# Patient Record
Sex: Male | Born: 1970 | Race: Black or African American | Hispanic: No | Marital: Single | Smoking: Current every day smoker
Health system: Southern US, Community
[De-identification: ages and names within clinical notes are randomized; demographics above are authoritative.]

## PROBLEM LIST (undated history)

## (undated) DIAGNOSIS — D869 Sarcoidosis, unspecified: Secondary | ICD-10-CM

## (undated) DIAGNOSIS — N281 Cyst of kidney, acquired: Secondary | ICD-10-CM

## (undated) DIAGNOSIS — D126 Benign neoplasm of colon, unspecified: Secondary | ICD-10-CM

## (undated) DIAGNOSIS — I10 Essential (primary) hypertension: Secondary | ICD-10-CM

## (undated) DIAGNOSIS — K219 Gastro-esophageal reflux disease without esophagitis: Secondary | ICD-10-CM

## (undated) DIAGNOSIS — K648 Other hemorrhoids: Secondary | ICD-10-CM

## (undated) DIAGNOSIS — K429 Umbilical hernia without obstruction or gangrene: Secondary | ICD-10-CM

## (undated) DIAGNOSIS — K449 Diaphragmatic hernia without obstruction or gangrene: Secondary | ICD-10-CM

## (undated) HISTORY — DX: Diaphragmatic hernia without obstruction or gangrene: K44.9

## (undated) HISTORY — DX: Benign neoplasm of colon, unspecified: D12.6

## (undated) HISTORY — DX: Cyst of kidney, acquired: N28.1

## (undated) HISTORY — DX: Gastro-esophageal reflux disease without esophagitis: K21.9

## (undated) HISTORY — DX: Umbilical hernia without obstruction or gangrene: K42.9

## (undated) HISTORY — DX: Other hemorrhoids: K64.8

## (undated) HISTORY — PX: COLONOSCOPY: SHX174

---

## 2000-05-19 HISTORY — PX: CHEST TUBE INSERTION: SHX231

## 2010-06-04 ENCOUNTER — Emergency Department (HOSPITAL_COMMUNITY)
Admission: EM | Admit: 2010-06-04 | Discharge: 2010-06-04 | Payer: Self-pay | Source: Home / Self Care | Admitting: Emergency Medicine

## 2010-06-17 ENCOUNTER — Emergency Department (HOSPITAL_COMMUNITY)
Admission: EM | Admit: 2010-06-17 | Discharge: 2010-06-17 | Payer: Self-pay | Source: Home / Self Care | Admitting: Emergency Medicine

## 2010-09-16 ENCOUNTER — Emergency Department (HOSPITAL_COMMUNITY)
Admission: EM | Admit: 2010-09-16 | Discharge: 2010-09-16 | Disposition: A | Discharge status: Home / Self Care | Payer: Self-pay | Attending: Emergency Medicine | Admitting: Emergency Medicine

## 2010-09-16 DIAGNOSIS — J329 Chronic sinusitis, unspecified: Secondary | ICD-10-CM | POA: Insufficient documentation

## 2010-09-16 DIAGNOSIS — D869 Sarcoidosis, unspecified: Secondary | ICD-10-CM | POA: Insufficient documentation

## 2010-09-16 DIAGNOSIS — I10 Essential (primary) hypertension: Secondary | ICD-10-CM | POA: Insufficient documentation

## 2010-09-16 DIAGNOSIS — J3489 Other specified disorders of nose and nasal sinuses: Secondary | ICD-10-CM | POA: Insufficient documentation

## 2010-09-16 DIAGNOSIS — R51 Headache: Secondary | ICD-10-CM | POA: Insufficient documentation

## 2010-11-25 ENCOUNTER — Inpatient Hospital Stay (INDEPENDENT_AMBULATORY_CARE_PROVIDER_SITE_OTHER)
Admission: RE | Admit: 2010-11-25 | Discharge: 2010-11-25 | Disposition: A | Discharge status: Home / Self Care | Payer: Self-pay | Source: Ambulatory Visit | Attending: Family Medicine | Admitting: Family Medicine

## 2010-11-25 DIAGNOSIS — K219 Gastro-esophageal reflux disease without esophagitis: Secondary | ICD-10-CM

## 2010-11-25 LAB — RPR: RPR Ser Ql: NONREACTIVE

## 2010-11-25 LAB — HIV ANTIBODY (ROUTINE TESTING W REFLEX): HIV: NONREACTIVE

## 2010-11-26 LAB — GC/CHLAMYDIA PROBE AMP, URINE: GC Probe Amp, Urine: NEGATIVE

## 2014-05-25 ENCOUNTER — Encounter (HOSPITAL_COMMUNITY): Payer: Self-pay | Admitting: Emergency Medicine

## 2014-05-25 ENCOUNTER — Emergency Department (HOSPITAL_COMMUNITY)
Admission: EM | Admit: 2014-05-25 | Discharge: 2014-05-25 | Disposition: A | Discharge status: Home / Self Care | Payer: Self-pay | Source: Home / Self Care | Attending: Family Medicine | Admitting: Family Medicine

## 2014-05-25 DIAGNOSIS — D869 Sarcoidosis, unspecified: Secondary | ICD-10-CM

## 2014-05-25 DIAGNOSIS — I10 Essential (primary) hypertension: Secondary | ICD-10-CM

## 2014-05-25 DIAGNOSIS — J45901 Unspecified asthma with (acute) exacerbation: Secondary | ICD-10-CM

## 2014-05-25 DIAGNOSIS — J0100 Acute maxillary sinusitis, unspecified: Secondary | ICD-10-CM

## 2014-05-25 HISTORY — DX: Sarcoidosis, unspecified: D86.9

## 2014-05-25 HISTORY — DX: Essential (primary) hypertension: I10

## 2014-05-25 LAB — POCT I-STAT, CHEM 8
BUN: 18 mg/dL (ref 6–23)
CALCIUM ION: 1.26 mmol/L — AB (ref 1.12–1.23)
CHLORIDE: 106 meq/L (ref 96–112)
Creatinine, Ser: 1.3 mg/dL (ref 0.50–1.35)
GLUCOSE: 100 mg/dL — AB (ref 70–99)
HEMATOCRIT: 45 % (ref 39.0–52.0)
Hemoglobin: 15.3 g/dL (ref 13.0–17.0)
Potassium: 4.1 mmol/L (ref 3.5–5.1)
Sodium: 142 mmol/L (ref 135–145)
TCO2: 22 mmol/L (ref 0–100)

## 2014-05-25 MED ORDER — METHYLPREDNISOLONE SODIUM SUCC 125 MG IJ SOLR
INTRAMUSCULAR | Status: AC
  Filled 2014-05-25: qty 2

## 2014-05-25 MED ORDER — AEROCHAMBER PLUS FLO-VU MEDIUM MISC
1.0000 | Freq: Once | Status: AC
  Administered 2014-05-25: 1

## 2014-05-25 MED ORDER — AMOXICILLIN-POT CLAVULANATE 875-125 MG PO TABS: 1.0000 | ORAL_TABLET | Freq: Two times a day (BID) | ORAL | Status: DC

## 2014-05-25 MED ORDER — ONDANSETRON 4 MG PO TBDP
4.0000 mg | ORAL_TABLET | Freq: Once | ORAL | Status: AC
  Administered 2014-05-25: 4 mg via ORAL

## 2014-05-25 MED ORDER — METHYLPREDNISOLONE SODIUM SUCC 125 MG IJ SOLR: 125.0000 mg | Freq: Once | INTRAMUSCULAR | Status: DC

## 2014-05-25 MED ORDER — AEROCHAMBER PLUS W/MASK MISC
Status: AC
  Filled 2014-05-25: qty 1

## 2014-05-25 MED ORDER — ONDANSETRON 4 MG PO TBDP
ORAL_TABLET | ORAL | Status: AC
  Filled 2014-05-25: qty 1

## 2014-05-25 MED ORDER — ALBUTEROL SULFATE HFA 108 (90 BASE) MCG/ACT IN AERS
INHALATION_SPRAY | RESPIRATORY_TRACT | Status: AC
  Filled 2014-05-25: qty 6.7

## 2014-05-25 MED ORDER — LISINOPRIL 5 MG PO TABS: 5.0000 mg | ORAL_TABLET | Freq: Every day | ORAL | Status: DC

## 2014-05-25 MED ORDER — METHYLPREDNISOLONE SODIUM SUCC 125 MG IJ SOLR
125.0000 mg | Freq: Once | INTRAMUSCULAR | Status: AC
  Administered 2014-05-25: 125 mg via INTRAMUSCULAR

## 2014-05-25 MED ORDER — PREDNISONE 50 MG PO TABS: ORAL_TABLET | ORAL | Status: DC

## 2014-05-25 MED ORDER — ALBUTEROL SULFATE HFA 108 (90 BASE) MCG/ACT IN AERS
2.0000 | INHALATION_SPRAY | Freq: Once | RESPIRATORY_TRACT | Status: AC
  Administered 2014-05-25: 2 via RESPIRATORY_TRACT

## 2014-05-25 MED ORDER — IPRATROPIUM BROMIDE 0.06 % NA SOLN: 2.0000 | Freq: Four times a day (QID) | NASAL | Status: AC

## 2014-05-25 NOTE — ED Provider Notes (Signed)
CSN: [id number]     Arrival date & time 05/25/14  0865 History   First MD Initiated Contact with Patient 05/25/14 0920     Chief Complaint  Patient presents with  . Facial Pain  . Headache  . Weakness  . Nausea   (Consider location/radiation/quality/duration/timing/severity/associated sxs/prior Treatment) HPI  Developed cough 2 days ago. Mild phlegm production. Associated w/ nasal congestion, facial pain and headaches. Subjective fevers. Denies sick contacts. Also w/ diarrhea during this time but w/o abd pain. Denies nausea and vomiting. Associated w/ intermittent wheezing for past week.    HTN: out of lisinopril since June. Pt does not have primary care doctor. Deneis CP, palpitations, SOB.home BPs from time to tome up to 784 systolic.       Past Medical History  Diagnosis Date  . Hypertension   . Sarcoidosis    History reviewed. No pertinent past surgical history. History reviewed. No pertinent family history. History  Substance Use Topics  . Smoking status: Current Every Day Smoker -- 0.50 packs/day    Types: Cigarettes  . Smokeless tobacco: Never Used  . Alcohol Use: Yes     Comment: occasionally    Review of Systems Per HPI with all other pertinent systems negative.   Allergies  Review of patient's allergies indicates no known allergies.  Home Medications   Prior to Admission medications   Medication Sig Start Date End Date Taking? Authorizing Provider  guaiFENesin-dextromethorphan (ROBITUSSIN DM) 100-10 MG/5ML syrup Take 5 mLs by mouth every 4 (four) hours as needed for cough.   Yes Historical Provider, MD  amoxicillin-clavulanate (AUGMENTIN) 875-125 MG per tablet Take 1 tablet by mouth 2 (two) times daily. 05/25/14   Waldemar Dickens, MD  ipratropium (ATROVENT) 0.06 % nasal spray Place 2 sprays into both nostrils 4 (four) times daily. 05/25/14   Waldemar Dickens, MD  lisinopril (PRINIVIL,ZESTRIL) 5 MG tablet Take 1 tablet (5 mg total) by mouth daily. 05/25/14   Waldemar Dickens, MD  predniSONE (DELTASONE) 50 MG tablet Take daily with breakfast 05/25/14   Waldemar Dickens, MD   BP 135/82 mmHg  Pulse 91  Temp(Src) 98.5 F (36.9 C) (Oral)  Resp 16  SpO2 100% Physical Exam  Constitutional: He is oriented to person, place, and time. He appears well-developed and well-nourished. No distress.  HENT:  Head: Normocephalic and atraumatic.  Mouth/Throat: Oropharynx is clear and moist.  Maxillary sinuses ttp  Eyes: EOM are normal. Pupils are equal, round, and reactive to light.  Neck: Normal range of motion.  Cardiovascular: Normal rate, normal heart sounds and intact distal pulses.   No murmur heard. Pulmonary/Chest: Effort normal.  Wheezing throughout Good air movemement No ronchi or crackles  Abdominal: Soft. Bowel sounds are normal. He exhibits no distension. There is no tenderness.  Musculoskeletal: Normal range of motion. He exhibits no edema or tenderness.  Neurological: He is alert and oriented to person, place, and time.  Skin: No rash noted. He is not diaphoretic. No erythema.  Psychiatric: He has a normal mood and affect. His behavior is normal. Thought content normal.    ED Course  Procedures (including critical care time) Labs Review Labs Reviewed  POCT I-STAT, CHEM 8 - Abnormal; Notable for the following:    Glucose, Bld 100 (*)    Calcium, Ion 1.26 (*)    All other components within normal limits    Imaging Review No results found.   MDM   1. Sarcoidosis   2. Asthma flare  3. Acute maxillary sinusitis, recurrence not specified   4. Essential hypertension    Solumedrol 125mg  im in office Albuterol inh 2 puffs w/ spacer in office Zofran 4mg  ODT in office Pt to take home th inh and spacer Start prednisone daily x 5 days Albuterol Q4 hrs for 24-48 hrs Start nasal atrovent  If no improvement in 24 hrs then start augmentin for albuterol Start probiotic   Cr nml at 1.3 (upper limit of normal) Restart lisinopril F/u w/ new PCP  for repeat labs to check renal function  Precautions given and all questions answered  Linna Darner, MD Family Medicine 05/25/2014, 10:03 AM      Waldemar Dickens, MD 05/25/14 1004

## 2014-05-25 NOTE — Discharge Instructions (Signed)
You are suffering from a sinus infection and from bronchospasm or asthma flare You were given a breathing treatment and steroids to help treat this Please continue to use the inhaler every 4 hours for the next 1-2 days Please take th steroids daily Please also use the nasal atrovent to clear up your sinus congestion Consider using flonase after you finish your steroids Please [facility] for an appointment for a blood pressure check adn labs Your kidney numbers were at the high end of normal (Cr 1.3), and this will need to be checked again soon.  Please take the lisinopril as prescribed Please start the antibiotics if you are not better in 24-48 hrs.

## 2014-05-25 NOTE — ED Notes (Signed)
Pt woke up yesterday with what he described as sinus pressure and pain around his left eye.  This has since developed into a cough, nausea, and weakness.  Pt denies any fever at home and is afebrile here.  He has not had an appetite but has been trying to stay hydrated.

## 2014-05-29 ENCOUNTER — Emergency Department (HOSPITAL_COMMUNITY)
Admission: EM | Admit: 2014-05-29 | Discharge: 2014-05-29 | Disposition: A | Discharge status: Home / Self Care | Payer: Self-pay | Attending: Emergency Medicine | Admitting: Emergency Medicine

## 2014-05-29 ENCOUNTER — Encounter (HOSPITAL_COMMUNITY): Payer: Self-pay | Admitting: Emergency Medicine

## 2014-05-29 ENCOUNTER — Emergency Department (HOSPITAL_COMMUNITY): Payer: Self-pay

## 2014-05-29 DIAGNOSIS — I10 Essential (primary) hypertension: Secondary | ICD-10-CM | POA: Insufficient documentation

## 2014-05-29 DIAGNOSIS — R109 Unspecified abdominal pain: Secondary | ICD-10-CM

## 2014-05-29 DIAGNOSIS — Z79899 Other long term (current) drug therapy: Secondary | ICD-10-CM | POA: Insufficient documentation

## 2014-05-29 DIAGNOSIS — Z862 Personal history of diseases of the blood and blood-forming organs and certain disorders involving the immune mechanism: Secondary | ICD-10-CM | POA: Insufficient documentation

## 2014-05-29 DIAGNOSIS — K2970 Gastritis, unspecified, without bleeding: Secondary | ICD-10-CM | POA: Insufficient documentation

## 2014-05-29 DIAGNOSIS — K648 Other hemorrhoids: Secondary | ICD-10-CM | POA: Insufficient documentation

## 2014-05-29 DIAGNOSIS — Z720 Tobacco use: Secondary | ICD-10-CM | POA: Insufficient documentation

## 2014-05-29 LAB — COMPREHENSIVE METABOLIC PANEL
ALK PHOS: 85 U/L (ref 39–117)
ALT: 41 U/L (ref 0–53)
AST: 39 U/L — AB (ref 0–37)
Albumin: 3.5 g/dL (ref 3.5–5.2)
Anion gap: 6 (ref 5–15)
BUN: 12 mg/dL (ref 6–23)
CHLORIDE: 106 meq/L (ref 96–112)
CO2: 26 mmol/L (ref 19–32)
CREATININE: 1.17 mg/dL (ref 0.50–1.35)
Calcium: 9 mg/dL (ref 8.4–10.5)
GFR calc non Af Amer: 75 mL/min — ABNORMAL LOW (ref >90)
GFR, EST AFRICAN AMERICAN: 87 mL/min — AB (ref >90)
GLUCOSE: 111 mg/dL — AB (ref 70–99)
POTASSIUM: 4.1 mmol/L (ref 3.5–5.1)
Sodium: 138 mmol/L (ref 135–145)
Total Bilirubin: 0.4 mg/dL (ref 0.3–1.2)
Total Protein: 7.9 g/dL (ref 6.0–8.3)

## 2014-05-29 LAB — CBC WITH DIFFERENTIAL/PLATELET
BASOS PCT: 0 % (ref 0–1)
Basophils Absolute: 0 10*3/uL (ref 0.0–0.1)
EOS PCT: 0 % (ref 0–5)
Eosinophils Absolute: 0 10*3/uL (ref 0.0–0.7)
HEMATOCRIT: 38.1 % — AB (ref 39.0–52.0)
HEMOGLOBIN: 13.2 g/dL (ref 13.0–17.0)
Lymphocytes Relative: 14 % (ref 12–46)
Lymphs Abs: 1.3 10*3/uL (ref 0.7–4.0)
MCH: 28.9 pg (ref 26.0–34.0)
MCHC: 34.6 g/dL (ref 30.0–36.0)
MCV: 83.4 fL (ref 78.0–100.0)
MONO ABS: 0.2 10*3/uL (ref 0.1–1.0)
MONOS PCT: 3 % (ref 3–12)
NEUTROS PCT: 83 % — AB (ref 43–77)
Neutro Abs: 7.4 10*3/uL (ref 1.7–7.7)
PLATELETS: 303 10*3/uL (ref 150–400)
RBC: 4.57 MIL/uL (ref 4.22–5.81)
RDW: 15.2 % (ref 11.5–15.5)
WBC: 8.9 10*3/uL (ref 4.0–10.5)

## 2014-05-29 LAB — URINALYSIS, ROUTINE W REFLEX MICROSCOPIC
Bilirubin Urine: NEGATIVE
Glucose, UA: NEGATIVE mg/dL
Hgb urine dipstick: NEGATIVE
KETONES UR: NEGATIVE mg/dL
LEUKOCYTES UA: NEGATIVE
Nitrite: NEGATIVE
PH: 5.5 (ref 5.0–8.0)
PROTEIN: NEGATIVE mg/dL
Specific Gravity, Urine: 1.015 (ref 1.005–1.030)
UROBILINOGEN UA: 0.2 mg/dL (ref 0.0–1.0)

## 2014-05-29 LAB — SAMPLE TO BLOOD BANK

## 2014-05-29 LAB — POC OCCULT BLOOD, ED: FECAL OCCULT BLD: POSITIVE — AB

## 2014-05-29 MED ORDER — IOHEXOL 300 MG/ML  SOLN: 25.0000 mL | INTRAMUSCULAR | Status: AC

## 2014-05-29 MED ORDER — PANTOPRAZOLE SODIUM 40 MG PO TBEC: 40.0000 mg | DELAYED_RELEASE_TABLET | Freq: Every day | ORAL | Status: DC

## 2014-05-29 MED ORDER — HYDROCORTISONE 2.5 % RE CREA: TOPICAL_CREAM | RECTAL | Status: DC

## 2014-05-29 MED ORDER — SODIUM CHLORIDE 0.9 % IV BOLUS (SEPSIS)
1000.0000 mL | Freq: Once | INTRAVENOUS | Status: AC
  Administered 2014-05-29: 1000 mL via INTRAVENOUS

## 2014-05-29 MED ORDER — TRAMADOL HCL 50 MG PO TABS: 50.0000 mg | ORAL_TABLET | Freq: Four times a day (QID) | ORAL | Status: DC | PRN

## 2014-05-29 MED ORDER — SODIUM CHLORIDE 0.9 % IV SOLN
80.0000 mg | Freq: Once | INTRAVENOUS | Status: AC
  Administered 2014-05-29: 80 mg via INTRAVENOUS
  Filled 2014-05-29: qty 80

## 2014-05-29 MED ORDER — MORPHINE SULFATE 4 MG/ML IJ SOLN
4.0000 mg | Freq: Once | INTRAMUSCULAR | Status: AC
  Administered 2014-05-29: 4 mg via INTRAVENOUS
  Filled 2014-05-29: qty 1

## 2014-05-29 MED ORDER — IOHEXOL 300 MG/ML  SOLN
100.0000 mL | Freq: Once | INTRAMUSCULAR | Status: AC | PRN
  Administered 2014-05-29: 100 mL via INTRAVENOUS

## 2014-05-29 NOTE — ED Notes (Signed)
Pt sts dark rectal bleeding x 3 days with some generalized abd pain; pt sts pain in lower back

## 2014-05-29 NOTE — ED Notes (Signed)
Patient transported to CT 

## 2014-05-29 NOTE — Discharge Instructions (Signed)
[facility] to get [facility].   Follow up with [facility].   Take protonix daily.   Use anusol cream for hemorrhoids.   Take tylenol for pain, avoid motrin or alleve.   Take tramadol for severe pain. It may cause your gastritis to get worse.   Return to ER if you have more rectal bleeding, severe pain, vomiting up blood.

## 2014-05-29 NOTE — ED Provider Notes (Addendum)
CSN: [id number]     Arrival date & time 05/29/14  1214 History   First MD Initiated Contact with Patient 05/29/14 1505     Chief Complaint  Patient presents with  . Rectal Bleeding  . Abdominal Pain     (Consider location/radiation/quality/duration/timing/severity/associated sxs/prior Treatment) The history is provided by the patient.  Rodney Knox is a 44 y.o. male hx of HTN, sarcoidosis here with rectal bleeding, abdominal pain. Was started on prednisone, augmentin, lisinopril 3 days ago at urgent care for possible sinusitis and sarcoidosis flare. For the last 3 days, he has been having brown stool with blood around it, about 1-2 episodes a day. Had some diarrhea that resolved. Also diffuse abdominal pain and lower back pain. Denies vomiting or fever. Denies chest pain or shortness of breath.    Past Medical History  Diagnosis Date  . Hypertension   . Sarcoidosis    History reviewed. No pertinent past surgical history. History reviewed. No pertinent family history. History  Substance Use Topics  . Smoking status: Current Every Day Smoker -- 0.50 packs/day    Types: Cigarettes  . Smokeless tobacco: Never Used  . Alcohol Use: Yes     Comment: occasionally    Review of Systems  Gastrointestinal: Positive for abdominal pain, blood in stool and hematochezia.  All other systems reviewed and are negative.     Allergies  Review of patient's allergies indicates no known allergies.  Home Medications   Prior to Admission medications   Medication Sig Start Date End Date Taking? Authorizing Provider  albuterol (PROVENTIL HFA;VENTOLIN HFA) 108 (90 BASE) MCG/ACT inhaler Inhale 1-2 puffs into the lungs every 6 (six) hours as needed for wheezing or shortness of breath.   Yes Historical Provider, MD  amoxicillin-clavulanate (AUGMENTIN) 875-125 MG per tablet Take 1 tablet by mouth 2 (two) times daily. 05/25/14  Yes Waldemar Dickens, MD  dextromethorphan (DELSYM) 30 MG/5ML liquid Take 60  mg by mouth as needed for cough.   Yes Historical Provider, MD  lisinopril (PRINIVIL,ZESTRIL) 5 MG tablet Take 1 tablet (5 mg total) by mouth daily. 05/25/14  Yes Waldemar Dickens, MD  predniSONE (DELTASONE) 50 MG tablet Take daily with breakfast Patient taking differently: Take 50 mg by mouth daily with breakfast. Take daily with breakfast 05/25/14  Yes Waldemar Dickens, MD  Pseudoeph-Bromphen-DM (COLD & COUGH DM PO) Take 2 capsules by mouth as needed (for cold).   Yes Historical Provider, MD  ipratropium (ATROVENT) 0.06 % nasal spray Place 2 sprays into both nostrils 4 (four) times daily. 05/25/14   Waldemar Dickens, MD   BP 165/101 mmHg  Pulse 65  Temp(Src) 98.4 F (36.9 C) (Oral)  Resp 18  SpO2 99% Physical Exam  Constitutional: He is oriented to person, place, and time.  Uncomfortable   HENT:  Head: Normocephalic.  Mouth/Throat: Oropharynx is clear and moist.  Eyes: Conjunctivae are normal. Pupils are equal, round, and reactive to light.  Neck: Normal range of motion. Neck supple.  Cardiovascular: Normal rate, regular rhythm and normal heart sounds.   Pulmonary/Chest: Effort normal and breath sounds normal. No respiratory distress. He has no wheezes. He has no rales.  Abdominal: Soft. Bowel sounds are normal.  Mild diffuse tenderness, no rebound, worse in LLQ.   Genitourinary:  ? Internal hemorrhoid. Slightly pink stool.   Musculoskeletal: Normal range of motion. He exhibits no edema or tenderness.  Mild R paralumbar tenderness, no midline tenderness   Neurological: He is alert and oriented to  person, place, and time. No cranial nerve deficit. Coordination normal.  Skin: Skin is warm and dry.  Psychiatric: He has a normal mood and affect. His behavior is normal. Judgment and thought content normal.  Nursing note and vitals reviewed.   ED Course  Procedures (including critical care time) Labs Review Labs Reviewed  CBC WITH DIFFERENTIAL - Abnormal; Notable for the following:    HCT  38.1 (*)    Neutrophils Relative % 83 (*)    All other components within normal limits  COMPREHENSIVE METABOLIC PANEL - Abnormal; Notable for the following:    Glucose, Bld 111 (*)    AST 39 (*)    GFR calc non Af Amer 75 (*)    GFR calc Af Amer 87 (*)    All other components within normal limits  POC OCCULT BLOOD, ED - Abnormal; Notable for the following:    Fecal Occult Bld POSITIVE (*)    All other components within normal limits  URINALYSIS, ROUTINE W REFLEX MICROSCOPIC  SAMPLE TO BLOOD BANK    Imaging Review Ct Abdomen Pelvis W Contrast  05/29/2014   CLINICAL DATA:  44 year old with upper abdominal pain for 2 days. Pain radiates to the left flank and back.  EXAM: CT ABDOMEN AND PELVIS WITH CONTRAST  TECHNIQUE: Multidetector CT imaging of the abdomen and pelvis was performed using the standard protocol following bolus administration of intravenous contrast.  CONTRAST:  135mL OMNIPAQUE IOHEXOL 300 MG/ML  SOLN  COMPARISON:  None.  FINDINGS: Lung bases are clear.  Negative for free intraperitoneal air.  Normal appearance of the liver, gallbladder and portal venous system. Normal appearance of the pancreas, spleen and adrenal glands. There is a small low-density structure in both kidneys. These are too small to definitively characterize but likely represent cysts. The largest roughly measures 0.9 cm in the left kidney mid pole region. Negative for hydronephrosis. Normal appearance of the urinary bladder. Asymmetric wall thickening in the stomach anterior body region. This finding is nonspecific and no evidence for perigastric inflammation. Mild atherosclerotic disease in the common iliac arteries bilaterally. There is no significant free fluid or lymphadenopathy. No gross abnormality to the prostate or urinary bladder.  Small calcification in the appendix. No evidence for acute appendix inflammation. No gross abnormality to the small bowel or large bowel. There is a small periumbilical hernia  containing fat.  Small focus of sclerosis in the right ilium is nonspecific and could represent a small bone island. No acute bone abnormalities.  IMPRESSION: Asymmetric wall thickening in the stomach body region. Findings are nonspecific. Gastric inflammation and gastritis cannot be excluded on CT imaging. Recommend clinical correlation. If this is the area of clinical concern, consider further evaluation with endoscopy.  Small periumbilical hernia containing fat.  Possible small renal cysts as described.   Electronically Signed   By: Markus Daft M.D.   On: 05/29/2014 17:17     EKG Interpretation None      MDM   Final diagnoses:  Abdominal pain   Rodney Knox is a 44 y.o. male here with rectal bleeding, ab pain. Consider colitis vs small hemorrhoid bleed. Will get labs, CT ab/pel.   6:55 PM Hg 13, was 15 a week ago. Occ positive but brown stool. CT showed possible gastritis. I think he likely has gastritis from steroid use. Will stop steroids. I called Dr. Deatra Ina from GI who feels that given Hg 13 and that he is young, doesn't need to be admitted. Can start on protonix  and f/u outpatient. He has no insurance. Case management was able to get him f/u at [facility] center in a week. Will have him get repeat CBC tomorrow in clinic. He requests pain meds for back pain. I told him that narcotics may get gastritis worse. He has been taking tylenol, motrin. I told him to stop taking motrin. He wants tramadol, which I reluctantly gave him, instructing him that it may get gastritis worse. Gave strict return precautions.     Wandra Arthurs, MD 05/29/14 1857  Wandra Arthurs, MD 05/29/14 Lurline Hare

## 2014-05-29 NOTE — ED Notes (Signed)
EDP at bedside  

## 2014-05-30 ENCOUNTER — Ambulatory Visit: Payer: Self-pay | Attending: Internal Medicine

## 2014-05-31 NOTE — Care Management (Signed)
  CARE MANAGEMENT ED NOTE 05/31/2014  Patient:  Rodney Knox, Rodney Knox   Account Number:  [id number]  Date Initiated:  05/29/2014  Documentation initiated by:  Laurena Slimmer  Subjective/Objective Assessment:   patient presented to [facility] ED c/o rectal bleeding     Subjective/Objective Assessment Detail:     Action/Plan:   Refrral to establish with PCP   Action/Plan Detail:   Anticipated DC Date:  05/29/2014     Status Recommendation to Physician:   Result of Recommendation:  Agreed    DC Planning Services  CM consult  Follow-up appt scheduled    Choice offered to / List presented to: patient            Status of service:  Completed, signed off  ED Comments:   ED Comments Detail:  ED CM consulted by Dr. Darl Householder concerning establsihing f/u care writh PCP. Reviewed record, no PCP or health insurance.Met with patient at bedside, confimed information. discussed the [facility], and the [employer] and [employer] patient agreeable  with establsihing care. Provided information, offered to schedule f/u appt. patient verbaliszed appreciation. Scheduled appt with [facility] for 06/07/14 at 11:15 am patient agreeable. Discussed with Dr. Dominic Pea, he is agreeable and wants to schedule the patient for outpatient CBC tomorrow patient scheduled for 10am at the [facility]. Patient updated on these appts. verbalized understanding teach back done. No further ED CM needs

## 2014-06-07 ENCOUNTER — Encounter: Payer: Self-pay | Admitting: Internal Medicine

## 2014-06-07 ENCOUNTER — Ambulatory Visit: Payer: Self-pay | Attending: Internal Medicine | Admitting: Internal Medicine

## 2014-06-07 VITALS — BP 160/100 | HR 82 | Temp 98.0°F | Resp 16 | Wt 204.6 lb

## 2014-06-07 DIAGNOSIS — F17200 Nicotine dependence, unspecified, uncomplicated: Secondary | ICD-10-CM

## 2014-06-07 DIAGNOSIS — IMO0001 Reserved for inherently not codable concepts without codable children: Secondary | ICD-10-CM

## 2014-06-07 DIAGNOSIS — D860 Sarcoidosis of lung: Secondary | ICD-10-CM | POA: Insufficient documentation

## 2014-06-07 DIAGNOSIS — K2970 Gastritis, unspecified, without bleeding: Secondary | ICD-10-CM | POA: Insufficient documentation

## 2014-06-07 DIAGNOSIS — D869 Sarcoidosis, unspecified: Secondary | ICD-10-CM | POA: Insufficient documentation

## 2014-06-07 DIAGNOSIS — Z720 Tobacco use: Secondary | ICD-10-CM | POA: Insufficient documentation

## 2014-06-07 DIAGNOSIS — K649 Unspecified hemorrhoids: Secondary | ICD-10-CM | POA: Insufficient documentation

## 2014-06-07 DIAGNOSIS — Z23 Encounter for immunization: Secondary | ICD-10-CM | POA: Insufficient documentation

## 2014-06-07 DIAGNOSIS — I10 Essential (primary) hypertension: Secondary | ICD-10-CM | POA: Insufficient documentation

## 2014-06-07 DIAGNOSIS — R030 Elevated blood-pressure reading, without diagnosis of hypertension: Secondary | ICD-10-CM

## 2014-06-07 DIAGNOSIS — K625 Hemorrhage of anus and rectum: Secondary | ICD-10-CM | POA: Insufficient documentation

## 2014-06-07 DIAGNOSIS — Z862 Personal history of diseases of the blood and blood-forming organs and certain disorders involving the immune mechanism: Secondary | ICD-10-CM

## 2014-06-07 LAB — CBC WITH DIFFERENTIAL/PLATELET
Basophils Absolute: 0 10*3/uL (ref 0.0–0.1)
Basophils Relative: 0 % (ref 0–1)
EOS ABS: 0.1 10*3/uL (ref 0.0–0.7)
EOS PCT: 2 % (ref 0–5)
HEMATOCRIT: 40.1 % (ref 39.0–52.0)
HEMOGLOBIN: 13.4 g/dL (ref 13.0–17.0)
LYMPHS ABS: 1.9 10*3/uL (ref 0.7–4.0)
LYMPHS PCT: 27 % (ref 12–46)
MCH: 28.5 pg (ref 26.0–34.0)
MCHC: 33.4 g/dL (ref 30.0–36.0)
MCV: 85.1 fL (ref 78.0–100.0)
MPV: 9.3 fL (ref 8.6–12.4)
Monocytes Absolute: 0.6 10*3/uL (ref 0.1–1.0)
Monocytes Relative: 9 % (ref 3–12)
Neutro Abs: 4.5 10*3/uL (ref 1.7–7.7)
Neutrophils Relative %: 62 % (ref 43–77)
Platelets: 384 10*3/uL (ref 150–400)
RBC: 4.71 MIL/uL (ref 4.22–5.81)
RDW: 15.4 % (ref 11.5–15.5)
WBC: 7.2 10*3/uL (ref 4.0–10.5)

## 2014-06-07 LAB — COMPLETE METABOLIC PANEL WITH GFR
ALK PHOS: 83 U/L (ref 39–117)
ALT: 28 U/L (ref 0–53)
AST: 24 U/L (ref 0–37)
Albumin: 4.1 g/dL (ref 3.5–5.2)
BUN: 11 mg/dL (ref 6–23)
CHLORIDE: 106 meq/L (ref 96–112)
CO2: 27 meq/L (ref 19–32)
Calcium: 9.9 mg/dL (ref 8.4–10.5)
Creat: 1.05 mg/dL (ref 0.50–1.35)
GFR, EST NON AFRICAN AMERICAN: 87 mL/min
GFR, Est African American: >89 mL/min
Glucose, Bld: 100 mg/dL — ABNORMAL HIGH (ref 70–99)
Potassium: 4.6 mEq/L (ref 3.5–5.3)
SODIUM: 139 meq/L (ref 135–145)
TOTAL PROTEIN: 7.8 g/dL (ref 6.0–8.3)
Total Bilirubin: 0.4 mg/dL (ref 0.2–1.2)

## 2014-06-07 LAB — LIPID PANEL
Cholesterol: 192 mg/dL (ref 0–200)
HDL: 45 mg/dL (ref >39)
LDL CALC: 119 mg/dL — AB (ref 0–99)
TRIGLYCERIDES: 138 mg/dL (ref <150)
Total CHOL/HDL Ratio: 4.3 Ratio
VLDL: 28 mg/dL (ref 0–40)

## 2014-06-07 LAB — TSH: TSH: 1.454 u[IU]/mL (ref 0.350–4.500)

## 2014-06-07 MED ORDER — CLONIDINE HCL 0.1 MG PO TABS
0.2000 mg | ORAL_TABLET | Freq: Once | ORAL | Status: AC
  Administered 2014-06-07: 0.2 mg via ORAL

## 2014-06-07 MED ORDER — PANTOPRAZOLE SODIUM 40 MG PO TBEC: 40.0000 mg | DELAYED_RELEASE_TABLET | Freq: Every day | ORAL | Status: DC

## 2014-06-07 MED ORDER — NICOTINE 21 MG/24HR TD PT24: 21.0000 mg | MEDICATED_PATCH | Freq: Every day | TRANSDERMAL | Status: DC

## 2014-06-07 MED ORDER — RANITIDINE HCL 150 MG PO TABS: 150.0000 mg | ORAL_TABLET | Freq: Every day | ORAL | Status: DC

## 2014-06-07 MED ORDER — LOSARTAN POTASSIUM-HCTZ 50-12.5 MG PO TABS: 1.0000 | ORAL_TABLET | Freq: Every day | ORAL | Status: DC

## 2014-06-07 MED ORDER — HYDROCORTISONE 2.5 % RE CREA: TOPICAL_CREAM | RECTAL | Status: DC

## 2014-06-07 NOTE — Progress Notes (Signed)
Patient here for follow up from the ED Patient has a history of  Sarcoidosis and HTN Complains of having rectal bleeding and epigastric pain Patient states the protonix is not really working  Takes omeprazole as well Today presents with elevated blood pressure

## 2014-06-07 NOTE — Progress Notes (Signed)
Patient Demographics  Rodney Knox, is a 44 y.o. male  XTG:[id number]  EVO:[id number]  DOB - August 09, 1970  CC:  Chief Complaint  Patient presents with  . Establish Care       HPI: Rodney Knox is a 44 y.o. male here today to establish medical care.Patient has history of hypertension, sarcoidosis, recently went to the emergency room with symptoms of abdominal pain, rectal bleeding, patient had darker blood test positive stool, CT abdomen reported possible gastritis, there was a drop in hemoglobin level, GI was consulted and advised to start on Protonix, patient also has chronic lower back pain and was prescribed tramadol when necessary for pain. Today his blood pressure is elevated, is given clonidine and his repeat manual blood pressure is improved to 163/100, as per patient he has been taking lisinopril 5 mg but does not like taking this medication as per patient he was on a combination medication in the past with hydrochlorothiazide, patient is to smoke cigarettes, advised patient to cut down and quit, also taking Protonix as per patient it does not help much and he has been also taking Prilosec., He was prescribed hydrocortisone cream for hemorrhoids and is requesting refill on the medication still he has noticed some blood in h Patient has No headache, No chest pain, No abdominal pain - No Nausea, No new weakness tingling or numbness, No Cough - SOB.  No Known Allergies Past Medical History  Diagnosis Date  . Hypertension   . Sarcoidosis    Current Outpatient Prescriptions on File Prior to Visit  Medication Sig Dispense Refill  . albuterol (PROVENTIL HFA;VENTOLIN HFA) 108 (90 BASE) MCG/ACT inhaler Inhale 1-2 puffs into the lungs every 6 (six) hours as needed for wheezing or shortness of breath.    Marland Kitchen amoxicillin-clavulanate (AUGMENTIN) 875-125 MG per tablet Take 1 tablet by mouth 2 (two) times daily. 20 tablet 0  . dextromethorphan (DELSYM) 30 MG/5ML liquid Take 60 mg by  mouth as needed for cough.    Marland Kitchen ipratropium (ATROVENT) 0.06 % nasal spray Place 2 sprays into both nostrils 4 (four) times daily. 15 mL 12  . predniSONE (DELTASONE) 50 MG tablet Take daily with breakfast (Patient taking differently: Take 50 mg by mouth daily with breakfast. Take daily with breakfast) 5 tablet 0  . Pseudoeph-Bromphen-DM (COLD & COUGH DM PO) Take 2 capsules by mouth as needed (for cold).    . traMADol (ULTRAM) 50 MG tablet Take 1 tablet (50 mg total) by mouth every 6 (six) hours as needed. 8 tablet 0   No current facility-administered medications on file prior to visit.   History reviewed. No pertinent family history. History   Social History  . Marital Status: Single    Spouse Name: N/A    Number of Children: N/A  . Years of Education: N/A   Occupational History  . Not on file.   Social History Main Topics  . Smoking status: Current Every Day Smoker -- 0.50 packs/day for 20 years    Types: Cigarettes  . Smokeless tobacco: Never Used  . Alcohol Use: 0.0 oz/week    0 Not specified per week     Comment: occasionally  . Drug Use: No  . Sexual Activity: Not on file   Other Topics Concern  . Not on file   Social History Narrative    Review of Systems: Constitutional: Negative for fever, chills, diaphoresis, activity change, appetite change and fatigue. HENT: Negative for ear pain, nosebleeds, congestion, facial swelling, rhinorrhea, neck  pain, neck stiffness and ear discharge.  Eyes: Negative for pain, discharge, redness, itching and visual disturbance. Respiratory: Negative for cough, choking, chest tightness, shortness of breath, wheezing and stridor.  Cardiovascular: Negative for chest pain, palpitations and leg swelling. Gastrointestinal: Negative for abdominal distention. Genitourinary: Negative for dysuria, urgency, frequency, hematuria, flank pain, decreased urine volume, difficulty urinating and dyspareunia.  Musculoskeletal: Negative for back pain, joint  swelling, arthralgia and gait problem. Neurological: Negative for dizziness, tremors, seizures, syncope, facial asymmetry, speech difficulty, weakness, light-headedness, numbness and headaches.  Hematological: Negative for adenopathy. Does not bruise/bleed easily. Psychiatric/Behavioral: Negative for hallucinations, behavioral problems, confusion, dysphoric mood, decreased concentration and agitation.    Objective:   Filed Vitals:   06/07/14 1146  BP: 178/118  Pulse: 82  Temp:   Resp:     Physical Exam: Constitutional: Patient appears well-developed and well-nourished. No distress. HENT: Normocephalic, atraumatic, External right and left ear normal. Oropharynx is clear and moist.  Eyes: Conjunctivae and EOM are normal. PERRLA, no scleral icterus. Neck: Normal ROM. Neck supple. No JVD. No tracheal deviation. No thyromegaly. CVS: RRR, S1/S2 +, no murmurs, no gallops, no carotid bruit.  Pulmonary: Effort and breath sounds normal, no stridor, rhonchi, wheezes, rales.  Abdominal: Soft. BS +, no distension, tenderness, rebound or guarding.  Musculoskeletal: Normal range of motion. No edema and no tenderness.  Neuro: Alert. Normal reflexes, muscle tone coordination. No cranial nerve deficit. Skin: Skin is warm and dry. No rash noted. Not diaphoretic. No erythema. No pallor. Psychiatric: Normal mood and affect. Behavior, judgment, thought content normal.  Lab Results  Component Value Date   WBC 8.9 05/29/2014   HGB 13.2 05/29/2014   HCT 38.1* 05/29/2014   MCV 83.4 05/29/2014   PLT 303 05/29/2014   Lab Results  Component Value Date   CREATININE 1.17 05/29/2014   BUN 12 05/29/2014   NA 138 05/29/2014   K 4.1 05/29/2014   CL 106 05/29/2014   CO2 26 05/29/2014    No results found for: HGBA1C Lipid Panel  No results found for: CHOL, TRIG, HDL, CHOLHDL, VLDL, LDLCALC     Assessment and plan:   1. Elevated blood pressure  - cloNIDine (CATAPRES) tablet 0.2 mg; Take 2 tablets  (0.2 mg total) by mouth once. Repeat manual blood pressure is 160/100.  2. Essential hypertension Advise patient for DASH diet, started him on Hyzaar, patient will come back in 2 weeks per nurse visit BP check Ordered baseline blood work  - CBC with Differential - COMPLETE METABOLIC PANEL WITH GFR - TSH - Lipid panel - Vit D  25 hydroxy (rtn osteoporosis monitoring) - Hemoglobin A1c - losartan-hydrochlorothiazide (HYZAAR) 50-12.5 MG per tablet; Take 1 tablet by mouth daily.  Dispense: 90 tablet; Refill: 3  3. History of sarcoidosis Currently denies any symptoms uses albuterol when necessary  4. Rectal bleeding Will repeat CBC. - Ambulatory referral to Gastroenterology  5. Gastritis Have advised patient for lifestyle modification, quit smoking, avoid  NSAIDs, prescribed Zantac to take at night to and continue with Protonix in the daytime also follow with the GI  - ranitidine (ZANTAC) 150 MG tablet; Take 1 tablet (150 mg total) by mouth at bedtime.  Dispense: 30 tablet; Refill: 3 - Ambulatory referral to Gastroenterology - pantoprazole (PROTONIX) 40 MG tablet; Take 1 tablet (40 mg total) by mouth daily.  Dispense: 30 tablet; Refill: 3  6. Encounter for immunization Flu shot given today.  7. Smoking  - nicotine (NICODERM CQ) 21 mg/24hr patch; Place 1  patch (21 mg total) onto the skin daily.  Dispense: 28 patch; Refill: 0  8. Need for prophylactic vaccination against Streptococcus pneumoniae (pneumococcus) Pneumovax given today.  9. Hemorrhoids, unspecified hemorrhoid type  - hydrocortisone (ANUSOL-HC) 2.5 % rectal cream; Apply rectally 2 times daily  Dispense: 28.35 g; Refill: 0 - Ambulatory referral to Gastroenterology        Health Maintenance  -Vaccinations: Flu shot and Pneumovax given today  Return in about 3 months (around 09/06/2014) for hypertension, BP check in 2 weeks/Nurse Visit.  Lorayne Marek, MD

## 2014-06-07 NOTE — Patient Instructions (Signed)
Smoking Cessation Quitting smoking is important to your health and has many advantages. However, it is not always easy to quit since nicotine is a very addictive drug. Oftentimes, people try 3 times or more before being able to quit. This document explains the best ways for you to prepare to quit smoking. Quitting takes hard work and a lot of effort, but you can do it. ADVANTAGES OF QUITTING SMOKING  You will live longer, feel better, and live better.  Your body will feel the impact of quitting smoking almost immediately.  Within 20 minutes, blood pressure decreases. Your pulse returns to its normal level.  After 8 hours, carbon monoxide levels in the blood return to normal. Your oxygen level increases.  After 24 hours, the chance of having a heart attack starts to decrease. Your breath, hair, and body stop smelling like smoke.  After 48 hours, damaged nerve endings begin to recover. Your sense of taste and smell improve.  After 72 hours, the body is virtually free of nicotine. Your bronchial tubes relax and breathing becomes easier.  After 2 to 12 weeks, lungs can hold more air. Exercise becomes easier and circulation improves.  The risk of having a heart attack, stroke, cancer, or lung disease is greatly reduced.  After 1 year, the risk of coronary heart disease is cut in half.  After 5 years, the risk of stroke falls to the same as a nonsmoker.  After 10 years, the risk of lung cancer is cut in half and the risk of other cancers decreases significantly.  After 15 years, the risk of coronary heart disease drops, usually to the level of a nonsmoker.  If you are pregnant, quitting smoking will improve your chances of having a healthy baby.  The people you live with, especially any children, will be healthier.  You will have extra money to spend on things other than cigarettes. QUESTIONS TO THINK ABOUT BEFORE ATTEMPTING TO QUIT You may want to talk about your answers with your  health care provider.  Why do you want to quit?  If you tried to quit in the past, what helped and what did not?  What will be the most difficult situations for you after you quit? How will you plan to handle them?  Who can help you through the tough times? Your family? Friends? A health care provider?  What pleasures do you get from smoking? What ways can you still get pleasure if you quit? Here are some questions to ask your health care provider:  How can you help me to be successful at quitting?  What medicine do you think would be best for me and how should I take it?  What should I do if I need more help?  What is smoking withdrawal like? How can I get information on withdrawal? GET READY  Set a quit date.  Change your environment by getting rid of all cigarettes, ashtrays, matches, and lighters in your home, car, or work. Do not let people smoke in your home.  Review your past attempts to quit. Think about what worked and what did not. GET SUPPORT AND ENCOURAGEMENT You have a better chance of being successful if you have help. You can get support in many ways.  Tell your family, friends, and coworkers that you are going to quit and need their support. Ask them not to smoke around you.  Get individual, group, or telephone counseling and support. Programs are available at local hospitals and health centers. Call   your local health department for information about programs in your area.  Spiritual beliefs and practices may help some smokers quit.  Download a "quit meter" on your computer to keep track of quit statistics, such as how long you have gone without smoking, cigarettes not smoked, and money saved.  Get a self-help book about quitting smoking and staying off tobacco. [place] yourself from urges to smoke. Talk to someone, go for a walk, or occupy your time with a task.  Change your normal routine. Take a different route to work.  Drink tea instead of coffee. Eat breakfast in a different place.  Reduce your stress. Take a hot bath, exercise, or read a book.  Plan something enjoyable to do every day. Reward yourself for not smoking.  Explore interactive web-based programs that specialize in helping you quit. GET MEDICINE AND USE IT CORRECTLY Medicines can help you stop smoking and decrease the urge to smoke. Combining medicine with the above behavioral methods and support can greatly increase your chances of successfully quitting smoking.  Nicotine replacement therapy helps deliver nicotine to your body without the negative effects and risks of smoking. Nicotine replacement therapy includes nicotine gum, lozenges, inhalers, nasal sprays, and skin patches. Some may be available over-the-counter and others require a prescription.  Antidepressant medicine helps people abstain from smoking, but how this works is unknown. This medicine is available by prescription.  Nicotinic receptor partial agonist medicine simulates the effect of nicotine in your brain. This medicine is available by prescription. Ask your health care provider for advice about which medicines to use and how to use them based on your health history. Your health care provider will tell you what side effects to look out for if you choose to be on a medicine or therapy. Carefully read the information on the package. Do not use any other product containing nicotine while using a nicotine replacement product.  RELAPSE OR DIFFICULT SITUATIONS Most relapses occur within the first 3 months after quitting. Do not be discouraged if you start smoking again. Remember, most people try several times before finally quitting. You may have symptoms of withdrawal because your body is used to nicotine. You may crave cigarettes, be irritable, feel very hungry, cough often, get headaches, or have difficulty concentrating. The withdrawal symptoms are only temporary. They are strongest  when you first quit, but they will go away within 10-14 days. To reduce the chances of relapse, try to:  Avoid drinking alcohol. Drinking lowers your chances of successfully quitting.  Reduce the amount of caffeine you consume. Once you quit smoking, the amount of caffeine in your body increases and can give you symptoms, such as a rapid heartbeat, sweating, and anxiety.  Avoid smokers because they can make you want to smoke.  Do not let weight gain distract you. Many smokers will gain weight when they quit, usually less than 10 pounds. Eat a healthy diet and stay active. You can always lose the weight gained after you quit.  Find ways to improve your mood other than smoking. FOR MORE INFORMATION  www.smokefree.gov  Document Released: 04/29/2001 Document Revised: 09/19/2013 Document Reviewed: 08/14/2011 [facility] Patient Information 2015 [place], [place]. This information is not intended to replace advice given to you by your health care provider. Make sure you discuss any questions you have with your health care provider. High-Fiber Diet Fiber is found in fruits, vegetables, and grains. A high-fiber diet encourages the addition of more whole grains, legumes, fruits,  and vegetables in your diet. The recommended amount of fiber for adult males is 38 g per day. For adult females, it is 25 g per day. Pregnant and lactating women should get 28 g of fiber per day. If you have a digestive or bowel problem, ask your caregiver for advice before adding high-fiber foods to your diet. Eat a variety of high-fiber foods instead of only a select few type of foods.  PURPOSE  To increase stool bulk.  To make bowel movements more regular to prevent constipation.  To lower cholesterol.  To prevent overeating. WHEN IS THIS DIET USED?  It may be used if you have constipation and hemorrhoids.  It may be used if you have uncomplicated diverticulosis (intestine condition) and irritable bowel syndrome.  It  may be used if you need help with weight management.  It may be used if you want to add it to your diet as a protective measure against atherosclerosis, diabetes, and cancer. SOURCES OF FIBER  Whole-grain breads and cereals.  Fruits, such as apples, oranges, bananas, berries, prunes, and pears.  Vegetables, such as green peas, carrots, sweet potatoes, beets, broccoli, cabbage, spinach, and artichokes.  Legumes, such split peas, soy, lentils.  Almonds. FIBER CONTENT IN FOODS Starches and Grains / Dietary Fiber (g)  Cheerios, 1 cup / 3 g  Corn Flakes cereal, 1 cup / 0.7 g  Rice crispy treat cereal, 1 cup / 0.3 g  Instant oatmeal (cooked),  cup / 2 g  Frosted wheat cereal, 1 cup / 5.1 g  Brown, long-grain rice (cooked), 1 cup / 3.5 g  White, long-grain rice (cooked), 1 cup / 0.6 g  Enriched macaroni (cooked), 1 cup / 2.5 g Legumes / Dietary Fiber (g)  Baked beans (canned, plain, or vegetarian),  cup / 5.2 g  Kidney beans (canned),  cup / 6.8 g  Pinto beans (cooked),  cup / 5.5 g Breads and Crackers / Dietary Fiber (g)  Plain or honey graham crackers, 2 squares / 0.7 g  Saltine crackers, 3 squares / 0.3 g  Plain, salted pretzels, 10 pieces / 1.8 g  Whole-wheat bread, 1 slice / 1.9 g  White bread, 1 slice / 0.7 g  Raisin bread, 1 slice / 1.2 g  Plain bagel, 3 oz / 2 g  Flour tortilla, 1 oz / 0.9 g  Corn tortilla, 1 small / 1.5 g  Hamburger or hotdog bun, 1 small / 0.9 g Fruits / Dietary Fiber (g)  Apple with skin, 1 medium / 4.4 g  Sweetened applesauce,  cup / 1.5 g  Banana,  medium / 1.5 g  Grapes, 10 grapes / 0.4 g  Orange, 1 small / 2.3 g  Raisin, 1.5 oz / 1.6 g  Melon, 1 cup / 1.4 g Vegetables / Dietary Fiber (g)  Green beans (canned),  cup / 1.3 g  Carrots (cooked),  cup / 2.3 g  Broccoli (cooked),  cup / 2.8 g  Peas (cooked),  cup / 4.4 g  Mashed potatoes,  cup / 1.6 g  Lettuce, 1 cup / 0.5 g  Corn (canned),  cup /  1.6 g  Tomato,  cup / 1.1 g Document Released: 05/05/2005 Document Revised: 11/04/2011 Document Reviewed: 08/07/2011 [employer] Patient Information 2015 [place], [place]. This information is not intended to replace advice given to you by your health care provider. Make sure you discuss any questions you have with your health care provider. DASH Eating Plan DASH stands for "Dietary Approaches  to Stop Hypertension." The DASH eating plan is a healthy eating plan that has been shown to reduce high blood pressure (hypertension). Additional health benefits may include reducing the risk of type 2 diabetes mellitus, heart disease, and stroke. The DASH eating plan may also help with weight loss. WHAT DO I NEED TO KNOW ABOUT THE DASH EATING PLAN? For the DASH eating plan, you will follow these general guidelines:  Choose foods with a percent daily value for sodium of less than 5% (as listed on the food label).  Use salt-free seasonings or herbs instead of table salt or sea salt.  Check with your health care provider or pharmacist before using salt substitutes.  Eat lower-sodium products, often labeled as "lower sodium" or "no salt added."  Eat fresh foods.  Eat more vegetables, fruits, and low-fat dairy products.  Choose whole grains. Look for the word "whole" as the first word in the ingredient list.  Choose fish and skinless chicken or [place] more often than red meat. Limit fish, poultry, and meat to 6 oz (170 g) each day.  Limit sweets, desserts, sugars, and sugary drinks.  Choose heart-healthy fats.  Limit cheese to 1 oz (28 g) per day.  Eat more home-cooked food and less restaurant, buffet, and fast food.  Limit fried foods.  Cook foods using methods other than frying.  Limit canned vegetables. If you do use them, rinse them well to decrease the sodium.  When eating at a restaurant, ask that your food be prepared with less salt, or no salt if possible. WHAT FOODS CAN I EAT? Seek  help from a dietitian for individual calorie needs. Grains Whole grain or whole wheat bread. Brown rice. Whole grain or whole wheat pasta. Quinoa, bulgur, and whole grain cereals. Low-sodium cereals. Corn or whole wheat flour tortillas. Whole grain cornbread. Whole grain crackers. Low-sodium crackers. Vegetables Fresh or frozen vegetables (raw, steamed, roasted, or grilled). Low-sodium or reduced-sodium tomato and vegetable juices. Low-sodium or reduced-sodium tomato sauce and paste. Low-sodium or reduced-sodium canned vegetables.  Fruits All fresh, canned (in natural juice), or frozen fruits. Meat and Other Protein Products Ground beef (85% or leaner), grass-fed beef, or beef trimmed of fat. Skinless chicken or [place]. Ground chicken or [place]. Pork trimmed of fat. All fish and seafood. Eggs. Dried beans, peas, or lentils. Unsalted nuts and seeds. Unsalted canned beans. Dairy Low-fat dairy products, such as skim or 1% milk, 2% or reduced-fat cheeses, low-fat ricotta or cottage cheese, or plain low-fat yogurt. Low-sodium or reduced-sodium cheeses. Fats and Oils Tub margarines without trans fats. Light or reduced-fat mayonnaise and salad dressings (reduced sodium). Avocado. Safflower, olive, or canola oils. Natural peanut or almond butter. Other Unsalted popcorn and pretzels. The items listed above may not be a complete list of recommended foods or beverages. Contact your dietitian for more options. WHAT FOODS ARE NOT RECOMMENDED? Grains White bread. White pasta. White rice. Refined cornbread. Bagels and croissants. Crackers that contain trans fat. Vegetables Creamed or fried vegetables. Vegetables in a cheese sauce. Regular canned vegetables. Regular canned tomato sauce and paste. Regular tomato and vegetable juices. Fruits Dried fruits. Canned fruit in light or heavy syrup. Fruit juice. Meat and Other Protein Products Fatty cuts of meat. Ribs, chicken wings, bacon, sausage, bologna, salami,  chitterlings, fatback, hot dogs, bratwurst, and packaged luncheon meats. Salted nuts and seeds. Canned beans with salt. Dairy Whole or 2% milk, cream, half-and-half, and cream cheese. Whole-fat or sweetened yogurt. Full-fat cheeses or blue cheese. Nondairy creamers and  whipped toppings. Processed cheese, cheese spreads, or cheese curds. Condiments Onion and garlic salt, seasoned salt, table salt, and sea salt. Canned and packaged gravies. Worcestershire sauce. Tartar sauce. Barbecue sauce. Teriyaki sauce. Soy sauce, including reduced sodium. Steak sauce. Fish sauce. Oyster sauce. Cocktail sauce. Horseradish. Ketchup and mustard. Meat flavorings and tenderizers. Bouillon cubes. Hot sauce. Tabasco sauce. Marinades. Taco seasonings. Relishes. Fats and Oils Butter, stick margarine, lard, shortening, ghee, and bacon fat. Coconut, palm kernel, or palm oils. Regular salad dressings. Other Pickles and olives. Salted popcorn and pretzels. The items listed above may not be a complete list of foods and beverages to avoid. Contact your dietitian for more information. WHERE CAN I FIND MORE INFORMATION? National Heart, Lung, and Blood Institute: travelstabloid.com Document Released: 04/24/2011 Document Revised: 09/19/2013 Document Reviewed: 03/09/2013 [facility] Patient Information 2015 [place], [place]. This information is not intended to replace advice given to you by your health care provider. Make sure you discuss any questions you have with your health care provider.

## 2014-06-08 LAB — HEMOGLOBIN A1C
Hgb A1c MFr Bld: 5.7 % — ABNORMAL HIGH (ref <5.7)
Mean Plasma Glucose: 117 mg/dL — ABNORMAL HIGH (ref <117)

## 2014-06-08 LAB — VITAMIN D 25 HYDROXY (VIT D DEFICIENCY, FRACTURES): Vit D, 25-Hydroxy: 8 ng/mL — ABNORMAL LOW (ref 30–100)

## 2014-06-14 ENCOUNTER — Telehealth: Payer: Self-pay | Admitting: *Deleted

## 2014-06-14 MED ORDER — VITAMIN D (ERGOCALCIFEROL) 1.25 MG (50000 UNIT) PO CAPS: 50000.0000 [IU] | ORAL_CAPSULE | ORAL | Status: DC

## 2014-06-14 NOTE — Telephone Encounter (Signed)
Rx was send to [facility] Pt aware of lab results

## 2014-06-14 NOTE — Telephone Encounter (Signed)
-----   Message from Lorayne Marek, MD sent at 06/08/2014  9:13 AM EST ----- Blood work reviewed noticed impaired fasting glucose, call and advise patient for low carbohydrate diet.  noticed low vitamin D, call patient advise to start ergocalciferol 50,000 units once a week for the duration of  12 weeks. Also let the patient know that his blood count/hemoglobin is stable it has not dropped

## 2014-06-16 ENCOUNTER — Ambulatory Visit (INDEPENDENT_AMBULATORY_CARE_PROVIDER_SITE_OTHER): Payer: Self-pay | Admitting: Gastroenterology

## 2014-06-16 ENCOUNTER — Telehealth: Payer: Self-pay | Admitting: *Deleted

## 2014-06-16 ENCOUNTER — Encounter: Payer: Self-pay | Admitting: Gastroenterology

## 2014-06-16 VITALS — BP 134/88 | HR 92 | Ht 68.5 in | Wt 206.0 lb

## 2014-06-16 DIAGNOSIS — K625 Hemorrhage of anus and rectum: Secondary | ICD-10-CM

## 2014-06-16 DIAGNOSIS — K219 Gastro-esophageal reflux disease without esophagitis: Secondary | ICD-10-CM

## 2014-06-16 DIAGNOSIS — K649 Unspecified hemorrhoids: Secondary | ICD-10-CM

## 2014-06-16 DIAGNOSIS — R109 Unspecified abdominal pain: Secondary | ICD-10-CM

## 2014-06-16 DIAGNOSIS — R933 Abnormal findings on diagnostic imaging of other parts of digestive tract: Secondary | ICD-10-CM

## 2014-06-16 MED ORDER — HYDROCORTISONE ACETATE 25 MG RE SUPP: 25.0000 mg | Freq: Every day | RECTAL | Status: DC

## 2014-06-16 MED ORDER — DEXLANSOPRAZOLE 60 MG PO CPDR: 60.0000 mg | DELAYED_RELEASE_CAPSULE | Freq: Every day | ORAL | Status: DC

## 2014-06-16 MED ORDER — HYDROCORTISONE 2.5 % RE CREA: TOPICAL_CREAM | RECTAL | Status: DC

## 2014-06-16 MED ORDER — MOVIPREP 100 G PO SOLR: 1.0000 | Freq: Once | ORAL | Status: DC

## 2014-06-16 NOTE — Telephone Encounter (Signed)
Called the patient after I spoke to [employer] . She saw this patient in the office today.  Since [facility] doesn't have the Anusol 2.5 % cream, we told them to fill the prescription with 2.5 % Pramoxine cream. Also  , they do not have Dexilant so Agastya told me to send this this prescription to [facility].  Also I told him to get the suppositories at [facility] or [facility] and get Preperation H brand suppositories 1 % but he declined this suggestion.  He said he will just use the Pramoxine 2.5 % cream.

## 2014-06-16 NOTE — Patient Instructions (Addendum)
You have been scheduled for an endoscopy and colonoscopy. Please follow the written instructions given to you at your visit today. Please pick up your prep at the pharmacy within the next 1-3 days. If you use inhalers (even only as needed), please bring them with you on the day of your procedure. Your physician has requested that you go to www.startemmi.com and enter the access code given to you at your visit today. This web site gives a general overview about your procedure. However, you should still follow specific instructions given to you by our office regarding your preparation for the procedure.  We have printed the follow prescriptions for you to take to you pharamcy: Dexilant, Hydrocortisone supp. and hydrocortisone cream Please continue taking Zantac every evening as needed Please discontinue protonix  JZ:PHXTAV Advani

## 2014-06-20 ENCOUNTER — Encounter: Payer: Self-pay | Admitting: Gastroenterology

## 2014-06-20 DIAGNOSIS — R933 Abnormal findings on diagnostic imaging of other parts of digestive tract: Secondary | ICD-10-CM | POA: Insufficient documentation

## 2014-06-20 DIAGNOSIS — R109 Unspecified abdominal pain: Secondary | ICD-10-CM | POA: Insufficient documentation

## 2014-06-20 DIAGNOSIS — K219 Gastro-esophageal reflux disease without esophagitis: Secondary | ICD-10-CM | POA: Insufficient documentation

## 2014-06-20 NOTE — Progress Notes (Addendum)
06/16/2014 Rodney Knox [id number] 08-Oct-1970   HISTORY OF PRESENT ILLNESS:  This is a 44 year old male who is new to our practice.  He comes in today with a few different complaints.  First, he wants to discuss having a colonoscopy.  He has been experiencing rectal bleeding with bowel movements for the past 3 weeks or so.  He says that sometimes it is bright red blood but other times it is darker red colored.  He has been using cream for hemorrhoids.  He thinks that he does have hemorrhoids and notices that the come out of his rectum at times.    He also complains of daily heartburn and reflux for the past 6 years.  He says that he constantly has symptoms despite taking pantoprazole 40 mg daily.  He is also using Zantac at bedtime and sometimes even takes a prilosec during the day as well.  Says that Tums, etc do not help his symptoms at all.  He reports burning epigastric pain as well.  He had a CT scan of the abdomen and pelvis with contrast on 05/29/2014 at which time he was noted to have asymmetric wall thickening in the stomach body region, findings non-specific.  Recent CBC, CMP, and TSH were unremarkable.  He was FOBT positive during and ED visit.   Past Medical History  Diagnosis Date  . Hypertension   . Sarcoidosis    Past Surgical History  Procedure Laterality Date  . Chest tube insertion  2002    reports that he has been smoking Cigarettes.  He has a 10 pack-year smoking history. He has never used smokeless tobacco. He reports that he drinks alcohol. He reports that he uses illicit drugs. family history includes Diabetes in his father, paternal grandmother, and paternal uncle. There is no history of Colon cancer, Colon polyps, Kidney disease, Esophageal cancer, or Gallbladder disease. No Known Allergies    Outpatient Encounter Prescriptions as of 06/16/2014  Medication Sig  . albuterol (PROVENTIL HFA;VENTOLIN HFA) 108 (90 BASE) MCG/ACT inhaler Inhale 1-2 puffs into the  lungs every 6 (six) hours as needed for wheezing or shortness of breath.  . hydrocortisone (ANUSOL-HC) 2.5 % rectal cream Apply rectally 2 times daily  . ipratropium (ATROVENT) 0.06 % nasal spray Place 2 sprays into both nostrils 4 (four) times daily.  Marland Kitchen losartan-hydrochlorothiazide (HYZAAR) 50-12.5 MG per tablet Take 1 tablet by mouth daily.  . nicotine (NICODERM CQ) 21 mg/24hr patch Place 1 patch (21 mg total) onto the skin daily.  . ranitidine (ZANTAC) 150 MG tablet Take 1 tablet (150 mg total) by mouth at bedtime.  . traMADol (ULTRAM) 50 MG tablet Take 1 tablet (50 mg total) by mouth every 6 (six) hours as needed.  . Vitamin D, Ergocalciferol, (DRISDOL) 50000 UNITS CAPS capsule Take 1 capsule (50,000 Units total) by mouth every 7 (seven) days.  . [DISCONTINUED] amoxicillin-clavulanate (AUGMENTIN) 875-125 MG per tablet Take 1 tablet by mouth 2 (two) times daily.  . [DISCONTINUED] dextromethorphan (DELSYM) 30 MG/5ML liquid Take 60 mg by mouth as needed for cough.  . [DISCONTINUED] hydrocortisone (ANUSOL-HC) 2.5 % rectal cream Apply rectally 2 times daily  . [DISCONTINUED] pantoprazole (PROTONIX) 40 MG tablet Take 1 tablet (40 mg total) by mouth daily.  . [DISCONTINUED] predniSONE (DELTASONE) 50 MG tablet Take daily with breakfast (Patient taking differently: Take 50 mg by mouth daily with breakfast. Take daily with breakfast)  . [DISCONTINUED] Pseudoeph-Bromphen-DM (COLD & COUGH DM PO) Take 2 capsules by mouth  as needed (for cold).  Marland Kitchen dexlansoprazole (DEXILANT) 60 MG capsule Take 1 capsule (60 mg total) by mouth daily.  . hydrocortisone (ANUSOL-HC) 25 MG suppository Place 1 suppository (25 mg total) rectally at bedtime.  Marland Kitchen MOVIPREP 100 G SOLR Take 1 kit (200 g total) by mouth once. Lot number [id number] exp:11/2017  . [DISCONTINUED] dexlansoprazole (DEXILANT) 60 MG capsule Take 1 capsule (60 mg total) by mouth daily.  . [DISCONTINUED] dexlansoprazole (DEXILANT) 60 MG capsule Take 1 capsule (60 mg  total) by mouth daily.  . [DISCONTINUED] hydrocortisone (ANUSOL-HC) 25 MG suppository Place 1 suppository (25 mg total) rectally at bedtime.     REVIEW OF SYSTEMS  : All other systems reviewed and negative except where noted in the History of Present Illness.   PHYSICAL EXAM: BP 134/88 mmHg  Pulse 92  Ht 5' 8.5" (1.74 m)  Wt 206 lb (93.441 kg)  BMI 30.86 kg/m2 General: Well developed black male in no acute distress Head: Normocephalic and atraumatic Eyes:  Sclerae anicteric, conjunctiva pink. Ears: Normal auditory acuity Lungs: Clear throughout to auscultation Heart: Regular rate and rhythm Abdomen: Soft, non-distended.  Normal bowel sounds.  Mild epigastric TTP without R/R/G. Rectal:  Will be done soon at scheduled colonoscopy so not performed at today's visit. Musculoskeletal: Symmetrical with no gross deformities  Skin: No lesions on visible extremities Extremities: No edema  Neurological: Alert oriented x 4, grossly non-focal Psychological:  Alert and cooperative. Normal mood and affect  ASSESSMENT AND PLAN: -Rectal bleeding:  Possibly hemorrhoidal, but need to rule out more proximal, other causes of bleeding.  Will schedule for colonoscopy for further evaluation.  The risks, benefits, and alternatives were discussed with the patient and he consents to proceed.  Using analpram cream currently.  Will try anusol suppositories instead as patient does not think that he has external hemorrhoids as source of bleeding.   -GERD with epigastric pain and abnormal CT scan showing gastric wall thickening despite daily PPI therapy.  Will schedule for EGD in addition to colonoscopy.  Will discontinue pantoprazole and try Dexilant 60 mg daily instead.  Can continue Zantac at bedtime if needed.   Addendum: Reviewed and agree with initial management. Jerene Bears, MD

## 2014-06-29 ENCOUNTER — Ambulatory Visit (AMBULATORY_SURGERY_CENTER): Payer: Self-pay | Admitting: Internal Medicine

## 2014-06-29 ENCOUNTER — Telehealth: Payer: Self-pay | Admitting: Internal Medicine

## 2014-06-29 ENCOUNTER — Encounter: Payer: Self-pay | Admitting: Internal Medicine

## 2014-06-29 VITALS — BP 125/99 | HR 74 | Temp 98.8°F | Resp 24 | Ht 68.5 in | Wt 206.0 lb

## 2014-06-29 DIAGNOSIS — K2100 Gastro-esophageal reflux disease with esophagitis, without bleeding: Secondary | ICD-10-CM

## 2014-06-29 DIAGNOSIS — R933 Abnormal findings on diagnostic imaging of other parts of digestive tract: Secondary | ICD-10-CM

## 2014-06-29 DIAGNOSIS — K625 Hemorrhage of anus and rectum: Secondary | ICD-10-CM

## 2014-06-29 DIAGNOSIS — K2970 Gastritis, unspecified, without bleeding: Secondary | ICD-10-CM

## 2014-06-29 DIAGNOSIS — K2950 Unspecified chronic gastritis without bleeding: Secondary | ICD-10-CM

## 2014-06-29 DIAGNOSIS — D126 Benign neoplasm of colon, unspecified: Secondary | ICD-10-CM

## 2014-06-29 DIAGNOSIS — D125 Benign neoplasm of sigmoid colon: Secondary | ICD-10-CM

## 2014-06-29 DIAGNOSIS — D120 Benign neoplasm of cecum: Secondary | ICD-10-CM

## 2014-06-29 DIAGNOSIS — K219 Gastro-esophageal reflux disease without esophagitis: Secondary | ICD-10-CM

## 2014-06-29 DIAGNOSIS — K649 Unspecified hemorrhoids: Secondary | ICD-10-CM

## 2014-06-29 HISTORY — DX: Benign neoplasm of colon, unspecified: D12.6

## 2014-06-29 MED ORDER — SODIUM CHLORIDE 0.9 % IV SOLN: 500.0000 mL | INTRAVENOUS | Status: DC

## 2014-06-29 MED ORDER — HYDROCORTISONE 2.5 % RE CREA: 1.0000 "application " | TOPICAL_CREAM | Freq: Two times a day (BID) | RECTAL | Status: DC

## 2014-06-29 MED ORDER — PANTOPRAZOLE SODIUM 40 MG PO TBEC: 80.0000 mg | DELAYED_RELEASE_TABLET | Freq: Every day | ORAL | Status: DC

## 2014-06-29 NOTE — Progress Notes (Signed)
Called to room to assist during endoscopic procedure.  Patient ID and intended procedure confirmed with present staff. Received instructions for my participation in the procedure from the performing physician.  

## 2014-06-29 NOTE — Patient Instructions (Addendum)
YOU HAD AN ENDOSCOPIC PROCEDURE TODAY AT THE [facility]: Refer to the procedure report that was given to you for any specific questions about what was found during the examination.  If the procedure report does not answer your questions, please call your gastroenterologist to clarify.  If you requested that your care partner not be given the details of your procedure findings, then the procedure report has been included in a sealed envelope for you to review at your convenience later.  YOU SHOULD EXPECT: Some feelings of bloating in the abdomen. Passage of more gas than usual.  Walking can help get rid of the air that was put into your GI tract during the procedure and reduce the bloating. If you had a lower endoscopy (such as a colonoscopy or flexible sigmoidoscopy) you may notice spotting of blood in your stool or on the toilet paper. If you underwent a bowel prep for your procedure, then you may not have a normal bowel movement for a few days.  DIET: Your first meal following the procedure should be a light meal and then it is ok to progress to your normal diet.  A half-sandwich or bowl of soup is an example of a good first meal.  Heavy or fried foods are harder to digest and may make you feel nauseous or bloated.  Likewise meals heavy in dairy and vegetables can cause extra gas to form and this can also increase the bloating.  Drink plenty of fluids but you should avoid alcoholic beverages for 24 hours.  ACTIVITY: Your care partner should take you home directly after the procedure.  You should plan to take it easy, moving slowly for the rest of the day.  You can resume normal activity the day after the procedure however you should NOT DRIVE or use heavy machinery for 24 hours (because of the sedation medicines used during the test).    SYMPTOMS TO REPORT IMMEDIATELY: A gastroenterologist can be reached at any hour.  During normal business hours, 8:30 AM to 5:00 PM Monday through Friday,  call [phone].  After hours and on weekends, please call the GI answering service at [phone] who will take a message and have the physician on call contact you.   Following lower endoscopy (colonoscopy or flexible sigmoidoscopy):  Excessive amounts of blood in the stool  Significant tenderness or worsening of abdominal pains  Swelling of the abdomen that is new, acute  Fever of 100F or higher  Following upper endoscopy (EGD)  Vomiting of blood or coffee ground material  New chest pain or pain under the shoulder blades  Painful or persistently difficult swallowing  New shortness of breath  Fever of 100F or higher  Black, tarry-looking stools  FOLLOW UP: If any biopsies were taken you will be contacted by phone or by letter within the next 1-3 weeks.  Call your gastroenterologist if you have not heard about the biopsies in 3 weeks.  Our staff will call the home number listed on your records the next business day following your procedure to check on you and address any questions or concerns that you may have at that time regarding the information given to you following your procedure. This is a courtesy call and so if there is no answer at the home number and we have not heard from you through the emergency physician on call, we will assume that you have returned to your regular daily activities without incident.  SIGNATURES/CONFIDENTIALITY: You and/or your care   partner have signed paperwork which will be entered into your electronic medical record.  These signatures attest to the fact that that the information above on your After Visit Summary has been reviewed and is understood.  Full responsibility of the confidentiality of this discharge information lies with you and/or your care-partner.   Information on reflux and hiatal hernia given to you today  Continue Protonix 80mg  once daily ( 30 minutes before first meal of the day)  Information on polyps given to you  today  HOLD ASPIRIN AND ANTI -INFLAMMATORY PRODUCTS FOR 2 WEEKS  INFORMATION ON HEMORRHOIDS AND HEMORRHOIDAL BANDING GIVEN TO YOU TODAY  AWAIT BIOPSY RESULTS AND PATHOLOGY ON POLYPS REMOVED TODAY

## 2014-06-29 NOTE — Op Note (Signed)
[place]  [address]. [place], [address]   COLONOSCOPY PROCEDURE REPORT  PATIENT: [name], [name]  MR#: [id number] BIRTHDATE: [birth year] , 43  yrs. old GENDER: male ENDOSCOPIST: Jerene Bears, MD REFERRED HE:RDEYCX Advani, MD PROCEDURE DATE:  06/29/2014 PROCEDURE:   Colonoscopy with snare polypectomy First Screening Colonoscopy - Avg.  risk and is 50 yrs.  old or older - No.  Prior Negative Screening - Now for repeat screening. N/A  History of Adenoma - Now for follow-up colonoscopy & has been > or = to 3 yrs.  N/A  Polyps Removed Today? Yes. ASA CLASS:   Class II INDICATIONS:rectal bleeding. MEDICATIONS: Monitored anesthesia care, Propofol 150 mg IV, and Residual sedation present  DESCRIPTION OF PROCEDURE:   After the risks benefits and alternatives of the procedure were thoroughly explained, informed consent was obtained.  The digital rectal exam revealed no rectal mass.   The LB [id number]  endoscope was introduced through the anus and advanced to the terminal ileum which was intubated for a short distance. No adverse events experienced.   The quality of the prep was excellent, using MoviPrep  The instrument was then slowly withdrawn as the colon was fully examined.  COLON FINDINGS: The examined terminal ileum appeared to be normal. A pedunculated polyp measuring 12 mm in size was found at the cecum.  A polypectomy was performed using snare cautery.  The resection was complete, the polyp tissue was completely retrieved and sent to histology.   Two sessile polyps ranging from 5 to 67mm in size were found in the sigmoid colon.  Polypectomies were performed with a cold snare.  The resection was complete, the polyp tissue was completely retrieved and sent to histology.  Retroflexed views revealed internal hemorrhoids. The time to cecum=1 minutes 10 seconds.  Withdrawal time=8 minutes 57 seconds.  The scope was withdrawn and the procedure  completed.  COMPLICATIONS: There were no immediate complications.  ENDOSCOPIC IMPRESSION: 1.   The examined terminal ileum appeared to be normal 2.   Pedunculated polyp was found at the cecum; polypectomy was performed using snare cautery 3.   Two sessile polyps ranging from 5 to 76mm in size were found in the sigmoid colon; polypectomies were performed with a cold snare  RECOMMENDATIONS: 1.  Await pathology results 2.  Hold Aspirin and all other NSAIDs for 2 weeks. 3.  Repeat Colonoscopy in 3 years. 4.  You will receive a letter within 1-2 weeks with the results of your biopsy as well as final recommendations.  Please call my office if you have not received a letter after 3 weeks. 5.  Hemorrhoidal banding recommended for large, bleeding, internal hemorrhoids  eSigned:  Jerene Bears, MD 06/29/2014 11:39 AM   cc: The Patient, Dr. Kelly Splinter   PATIENT NAME:  Rodney Knox, Rodney Knox MR#: [id number]

## 2014-06-29 NOTE — Progress Notes (Signed)
Patient awakening,vss,report to rn 

## 2014-06-29 NOTE — Op Note (Signed)
[place]  [address]. [place], [address]   ENDOSCOPY PROCEDURE REPORT  PATIENT: Rodney Knox, Rodney Knox  MR#: [id number] BIRTHDATE: March 14, 1971 , 62  yrs. old GENDER: male ENDOSCOPIST: Jerene Bears, MD REFERRED BY:  Lorayne Marek, MD PROCEDURE DATE:  06/29/2014 PROCEDURE:  EGD w/ biopsy ASA CLASS:     Class II INDICATIONS:  heartburn, history of GERD, epigastric pain, and abnormal CT of the GI tract. MEDICATIONS: Monitored anesthesia care and Propofol 150 mg IV TOPICAL ANESTHETIC: none  DESCRIPTION OF PROCEDURE: After the risks benefits and alternatives of the procedure were thoroughly explained, informed consent was obtained.  The LB [id number] endoscope was introduced through the mouth and advanced to the second portion of the duodenum , Without limitations.  The instrument was slowly withdrawn as the mucosa was fully examined.  ESOPHAGUS: There was LA Class B esophagitis (One or more mucosal breaks > 16mm, but without continuity across mucosal folds) noted.   STOMACH: A small hiatal hernia was noted.   Gastropathy was found in the gastric antrum and gastric body.  Cold forcep biopsies were taken at the gastric body, antrum and angularis to evaluate for h. pylori.  DUODENUM: Mild duodenal inflammation was found in the duodenal bulb with appearing of peptic duodenitis.  The duodenal mucosa showed no abnormalities in the 2nd part of the duodenum.  Retroflexed views revealed a small hiatal hernia.     The scope was then withdrawn from the patient and the procedure completed.  COMPLICATIONS: There were no immediate complications.  ENDOSCOPIC IMPRESSION: 1.   There was LA Class B reflux esophagitis noted 2.   Small hiatal hernia 3.   Gastropathy was found in the gastric antrum and gastric body; multiple biopsies 4.   Duodenal inflammation was found in the duodenal bulb 5.   The duodenal mucosa showed no abnormalities in the 2nd part of the  duodenum  RECOMMENDATIONS: 1.  Await biopsy results 2.  Anti-reflux regimen to be follow 3.  Continue taking your PPI (pantoprazole 80 mg) once daily.  It is best to be taken 20-30 minutes prior to breakfast meal.  eSigned:  Jerene Bears, MD 06/29/2014 11:35 AM       CC:The Patient; Dr. Annitta Needs

## 2014-06-30 ENCOUNTER — Telehealth: Payer: Self-pay | Admitting: *Deleted

## 2014-06-30 NOTE — Telephone Encounter (Signed)
  Follow up Call-  Call back number 06/29/2014  Post procedure Call Back phone  # [phone]  Permission to leave phone message Yes     Patient questions:  Message left to call us if necessary.

## 2014-06-30 NOTE — Telephone Encounter (Signed)
Hemorrhoidal banding #1 and 2 scheduled. Patient has been advised. He states that he is doing well after his procedure yesterday.

## 2014-07-04 ENCOUNTER — Encounter: Payer: Self-pay | Admitting: Internal Medicine

## 2014-07-20 ENCOUNTER — Other Ambulatory Visit: Payer: Self-pay | Admitting: Internal Medicine

## 2014-07-24 ENCOUNTER — Telehealth: Payer: Self-pay | Admitting: Internal Medicine

## 2014-07-24 DIAGNOSIS — K625 Hemorrhage of anus and rectum: Secondary | ICD-10-CM

## 2014-07-24 NOTE — Telephone Encounter (Signed)
Left message for patient to call back. He was given #30 grams with 1 additional refill of hydrocortisone cream on 06/29/14. Has he already used both of these?

## 2014-07-25 MED ORDER — HYDROCORTISONE 2.5 % RE CREA: 1.0000 "application " | TOPICAL_CREAM | Freq: Two times a day (BID) | RECTAL | Status: DC

## 2014-07-25 NOTE — Telephone Encounter (Signed)
Patient states that he has used both refills of hydrocortisone cream. We will send him 1 more refill until his appointment with Dr Hilarie Fredrickson on 08-08-14.

## 2014-08-08 ENCOUNTER — Encounter: Payer: Self-pay | Admitting: Internal Medicine

## 2014-08-08 ENCOUNTER — Ambulatory Visit (INDEPENDENT_AMBULATORY_CARE_PROVIDER_SITE_OTHER): Payer: Self-pay | Admitting: Internal Medicine

## 2014-08-08 VITALS — BP 150/91 | HR 84 | Ht 68.5 in | Wt 205.0 lb

## 2014-08-08 DIAGNOSIS — K2970 Gastritis, unspecified, without bleeding: Secondary | ICD-10-CM

## 2014-08-08 DIAGNOSIS — K625 Hemorrhage of anus and rectum: Secondary | ICD-10-CM

## 2014-08-08 DIAGNOSIS — K648 Other hemorrhoids: Secondary | ICD-10-CM

## 2014-08-08 MED ORDER — HYDROCORTISONE 2.5 % RE CREA: 1.0000 "application " | TOPICAL_CREAM | Freq: Two times a day (BID) | RECTAL | Status: DC

## 2014-08-08 MED ORDER — RANITIDINE HCL 150 MG PO TABS: 150.0000 mg | ORAL_TABLET | Freq: Every day | ORAL | Status: DC

## 2014-08-08 NOTE — Progress Notes (Signed)
Patient ID: Rodney Knox, male   DOB: [birth year], 44 y.o.   MRN: [id number]  Patient seen recently for rectal bleeding. Came for colonoscopy which was performed on 06/29/2014. This revealed 3 polyps one adenoma, the others hyperplastic. He was also noted to have internal hemorrhoids felt to be the source of his bleeding. We discussed hemorrhoidal banding and he wished to proceed  .PROCEDURE NOTE: The patient presents with symptomatic grade 2-3 internal  hemorrhoids, requesting rubber band ligation of his hemorrhoidal disease.  All risks, benefits and alternative forms of therapy were described and informed consent was obtained. --primary symptoms are prolapsed and bleeding  The anorectum was pre-medicated with 0.125% lidocaine The decision was made to band the LL (1st band) internal hemorrhoid, and the [place] was used to perform band ligation without complication.  Digital anorectal examination was then performed to assure proper positioning of the band, and to adjust the banded tissue as required.  The patient was discharged home without pain or other issues.  Dietary and behavioral recommendations were given and along with follow-up instructions.     The following adjunctive treatments were recommended: Benefiber 1 tablespoon 1-2 x daily  The patient will return in 2-3 weeks for  follow-up and possible additional banding as required. No complications were encountered and the patient tolerated the procedure well.

## 2014-08-08 NOTE — Patient Instructions (Signed)
You have been scheduled for your 2nd hemorrhoidal banding with Dr Hilarie Fredrickson on Thursday, 08/31/14 @ 2:15 pm.  You have been scheduled for your 3rd hemorrhoidal banding with Dr Hilarie Fredrickson on Monday, 10/02/14 @ 4:00 pm.  We have sent the following medications to your pharmacy for you to pick up at your convenience: Ranitidine Hydrocortisone

## 2014-08-16 ENCOUNTER — Encounter: Payer: Self-pay | Admitting: *Deleted

## 2014-08-31 ENCOUNTER — Encounter: Payer: Self-pay | Admitting: Internal Medicine

## 2014-08-31 ENCOUNTER — Ambulatory Visit (INDEPENDENT_AMBULATORY_CARE_PROVIDER_SITE_OTHER): Payer: Self-pay | Admitting: Internal Medicine

## 2014-08-31 VITALS — BP 150/110 | HR 104 | Ht 68.5 in | Wt 206.4 lb

## 2014-08-31 DIAGNOSIS — K648 Other hemorrhoids: Secondary | ICD-10-CM

## 2014-08-31 DIAGNOSIS — K2970 Gastritis, unspecified, without bleeding: Secondary | ICD-10-CM

## 2014-08-31 DIAGNOSIS — D869 Sarcoidosis, unspecified: Secondary | ICD-10-CM

## 2014-08-31 MED ORDER — ALBUTEROL SULFATE HFA 108 (90 BASE) MCG/ACT IN AERS
1.0000 | INHALATION_SPRAY | Freq: Four times a day (QID) | RESPIRATORY_TRACT | Status: DC | PRN
Start: 2014-08-31 — End: 2023-05-06

## 2014-08-31 NOTE — Patient Instructions (Addendum)
You have been scheduled for an appointment with Dr Lake Bells at [facility] (2nd floor of [address] [facility]) for sarcoidosis on 09/11/14 @ 3:45 pm. Please arrive at 3:30 pm for registration. Make certain to bring medications, insurance cards and any copay you may have.  You should still have refills on ranitidine and Protonix. Please continue taking as prescribed.  We have sent the following medications to your pharmacy for you to pick up at your convenience: Albuterol (further refills should come from your primary care doctor or pulmonary doctor)  You have been scheduled for your 3rd hemorrhoidal banding with Dr Hilarie Fredrickson on Monday, 10/02/14 @ 4:00 pm.  HEMORRHOID BANDING PROCEDURE    FOLLOW-UP CARE   1. The procedure you have had should have been relatively painless since the banding of the area involved does not have nerve endings and there is no pain sensation.  The rubber band cuts off the blood supply to the hemorrhoid and the band may fall off as soon as 48 hours after the banding (the band may occasionally be seen in the toilet bowl following a bowel movement). You may notice a temporary feeling of fullness in the rectum which should respond adequately to plain Tylenol or Motrin.  2. Following the banding, avoid strenuous exercise that evening and resume full activity the next day.  A sitz bath (soaking in a warm tub) or bidet is soothing, and can be useful for cleansing the area after bowel movements.     3. To avoid constipation, take two tablespoons of natural wheat bran, natural oat bran, flax, Benefiber or any over the counter fiber supplement and increase your water intake to 7-8 glasses daily.    4. Unless you have been prescribed anorectal medication, do not put anything inside your rectum for two weeks: No suppositories, enemas, fingers, etc.  5. Occasionally, you may have more bleeding than usual after the banding procedure.  This is often from the untreated hemorrhoids  rather than the treated one.  Don't be concerned if there is a tablespoon or so of blood.  If there is more blood than this, lie flat with your bottom higher than your head and apply an ice pack to the area. If the bleeding does not stop within a half an hour or if you feel faint, call our office at [phone] or go to the emergency room.  6. Problems are not common; however, if there is a substantial amount of bleeding, severe pain, chills, fever or difficulty passing urine (very rare) or other problems, you should call us at [phone] or report to the nearest emergency room.  7. Do not stay seated continuously for more than 2-3 hours for a day or two after the procedure.  Tighten your buttock muscles 10-15 times every two hours and take 10-15 deep breaths every 1-2 hours.  Do not spend more than a few minutes on the toilet if you cannot empty your bowel; instead re-visit the toilet at a later time.

## 2014-08-31 NOTE — Progress Notes (Signed)
Patient ID: Rodney Knox, male   DOB: 20-May-1970, 44 y.o.   MRN: [id number] PROCEDURE NOTE: Patient returns for banding procedure #2. Initial banding performed in the left lateral internal hemorrhoid on 08/08/2014. Initial and primary symptoms were rectal bleeding and intermittent prolapse. Did very well after initial band place. He seen 80% less bleeding. Also less prolapse. Very happy with response Requesting refill of pantoprazole but she is using 80 mg per day as well as ranitidine to use as needed.Marland Kitchen GERD and reflux symptoms along with dyspepsia very much improved with this regimen. Also requesting refill of albuterol inhaler for intermittent wheezing and shortness of breath with history of sarcoid  The patient presents with symptomatic grade 2-3 hemorrhoids, requesting rubber band ligation of his hemorrhoidal disease.  All risks, benefits and alternative forms of therapy were described and informed consent was obtained.   The anorectum was pre-medicated with 0.125% nitroglycerin ointment The decision was made to band the RP (initial band #1 LL) internal hemorrhoid, and the [place] was used to perform band ligation without complication.  Digital anorectal examination was then performed to assure proper positioning of the band, and to adjust the banded tissue as required.  The patient was discharged home without pain or other issues.  Dietary and behavioral recommendations were given and along with follow-up instructions.     The following adjunctive treatments were recommended: Continue Benefiber 1 tablespoon daily --Refill pantoprazole 80 mg daily, ranitidine 150 mg as needed --Albuterol refill 1 and referral to Dr. 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 No complications were encountered and the patient tolerated the procedure well.

## 2014-09-11 ENCOUNTER — Institutional Professional Consult (permissible substitution): Payer: Self-pay | Admitting: Pulmonary Disease

## 2014-09-13 ENCOUNTER — Other Ambulatory Visit: Payer: Self-pay | Admitting: Internal Medicine

## 2014-09-18 ENCOUNTER — Telehealth: Payer: Self-pay | Admitting: Internal Medicine

## 2014-09-18 MED ORDER — PANTOPRAZOLE SODIUM 40 MG PO TBEC: 80.0000 mg | DELAYED_RELEASE_TABLET | Freq: Every day | ORAL | Status: DC

## 2014-09-18 NOTE — Telephone Encounter (Signed)
Rx sent 

## 2014-09-20 ENCOUNTER — Other Ambulatory Visit: Payer: Self-pay | Admitting: Internal Medicine

## 2014-09-26 NOTE — Telephone Encounter (Signed)
Patient overdue for 3 month f/u with PCP (Due 09/06/2014) Please ask patient to schedule appointment

## 2014-10-02 ENCOUNTER — Encounter: Payer: Self-pay | Admitting: Internal Medicine

## 2014-10-05 ENCOUNTER — Telehealth: Payer: Self-pay | Admitting: Internal Medicine

## 2014-10-05 NOTE — Telephone Encounter (Signed)
Patient rescheduled for 11/23/14 @ 1:45 pm. He verbalizes understanding.

## 2014-10-11 ENCOUNTER — Ambulatory Visit (INDEPENDENT_AMBULATORY_CARE_PROVIDER_SITE_OTHER)
Admission: RE | Admit: 2014-10-11 | Discharge: 2014-10-11 | Disposition: A | Discharge status: Home / Self Care | Payer: Self-pay | Source: Ambulatory Visit | Attending: Pulmonary Disease | Admitting: Pulmonary Disease

## 2014-10-11 ENCOUNTER — Ambulatory Visit (INDEPENDENT_AMBULATORY_CARE_PROVIDER_SITE_OTHER): Payer: Self-pay | Admitting: Pulmonary Disease

## 2014-10-11 ENCOUNTER — Encounter: Payer: Self-pay | Admitting: Pulmonary Disease

## 2014-10-11 VITALS — BP 148/110 | HR 108 | Ht 70.0 in | Wt 204.0 lb

## 2014-10-11 DIAGNOSIS — K2970 Gastritis, unspecified, without bleeding: Secondary | ICD-10-CM

## 2014-10-11 DIAGNOSIS — Z862 Personal history of diseases of the blood and blood-forming organs and certain disorders involving the immune mechanism: Secondary | ICD-10-CM

## 2014-10-11 DIAGNOSIS — R0602 Shortness of breath: Secondary | ICD-10-CM

## 2014-10-11 MED ORDER — PREDNISONE 10 MG PO TABS: ORAL_TABLET | ORAL | Status: DC

## 2014-10-11 MED ORDER — AEROCHAMBER MV MISC: Status: AC

## 2014-10-11 NOTE — Assessment & Plan Note (Signed)
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 He has been treated off and on with prednisone over the years. He has been experiencing more shortness of breath recently which is in constant in nature. He says it feels like when his sarcoid has flared up in the past.  He does have some wheezing on exam today and so I wonder if some of this is airways disease. He has no history of asthma but certainly sarcoid can cause an asthma-like picture in some folks.  Plan: Symbicort 2 puffs twice a day Pulmonary function test If no improvement with Symbicort then take prednisone taper Chest x-ray today EKG today Follow-up 4 weeks

## 2014-10-11 NOTE — Patient Instructions (Addendum)
We will call you with the results of today's chest x-ray We will arrange a pulmonary function test Take the Symbicort 2 puffs twice a day no matter how you feel, if no improvement in a week then take the prednisone taper QUIT SMOKING We will see you back in 4 weeks or sooner if needed

## 2014-10-11 NOTE — Assessment & Plan Note (Signed)
Encourage to quit smoking 

## 2014-10-11 NOTE — Assessment & Plan Note (Signed)
Recent workup including endoscopy revealed no evidence of GI bleeding or anemia. Presumably his shortness of breath is related to his sarcoidosis.

## 2014-10-11 NOTE — Progress Notes (Signed)
Subjective:    Patient ID: Rodney Knox, male    DOB: 1970-12-23, 44 y.o.   MRN: [id number]  HPI  Chief Complaint  Patient presents with  . Advice Only    Referred by Dr. Hilarie Fredrickson for Sarcoidosis.  Pt c/o increased sob, chest tightness, prod cough with clear to brown mucus X1.5 mos.     This is a very pleasant 44 year old male with a past medical history significant for biopsy-proven sarcoidosis who comes to my clinic today for evaluation of the same. He said that he had a normal childhood without respiratory illnesses but was diagnosed with sarcoidosis in 1999 after lymph node biopsy.   Diahe also had a collapsed lung in 2001 which was treated with a chest tube. He says that he has been treated with prednisone off and on over the years but it sounds like for the most part his sarcoidosis is not cause significant changes in his lifestyle. He does smoke and has smoked since his teenage years. He currently smokes 4-5 cigarettes a day. He uses an albuterol inhaler from time to time.  He tells me that over the last 6-8 weeks she's had increasing shortness of breath and chest tightness. This is not associated with a cough or mucus production. He says that when he takes the albuterol inhaler helps. He does not recall ever taking anything other than albuterol. He has never taken Symbicort or Advair that he is aware of. He denies chest pain or leg swelling. He was recently evaluated by GI and had an endoscopy. There is no evidence of GI bleeding or anemia that time.     Past Medical History  Diagnosis Date  . Hypertension   . Sarcoidosis   . Internal hemorrhoids   . GERD (gastroesophageal reflux disease)   . Periumbilical hernia   . Renal cyst   . Hiatal hernia   . Tubular adenoma of colon      Family History  Problem Relation Age of Onset  . Colon cancer Neg Hx   . Colon polyps Neg Hx   . Diabetes Father   . Diabetes Paternal Grandmother   . Diabetes Paternal Uncle   . Kidney  disease Neg Hx   . Esophageal cancer Neg Hx   . Gallbladder disease Neg Hx      History   Social History  . Marital Status: Single    Spouse Name: N/A  . Number of Children: 3  . 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 Smoking status: Current Every Day Smoker -- 0.50 packs/day for 23 years    Types: Cigarettes  . Smokeless tobacco: Never Used  . Alcohol Use: 0.0 oz/week    0 Standard drinks or equivalent per week     Comment: occasionally  . Drug Use: Yes     Comment: Used to smoke marijuana  . Sexual Activity: Not on file   Other Topics Concern  . Not on file   Social History Narrative     No Known Allergies   Outpatient Prescriptions Prior to Visit  Medication Sig Dispense Refill  . albuterol (PROVENTIL HFA;VENTOLIN HFA) 108 (90 BASE) MCG/ACT inhaler Inhale 1-2 puffs into the lungs every 6 (six) hours as needed for wheezing or shortness of breath. FURTHER REFILLS TO COME FROM PCP OR PULMONOLOGIST 8 g 0  . hydrocortisone (ANUSOL-HC) 2.5 % rectal cream Place 1 application rectally 2 (two) times  daily. 60 g 1  . ipratropium (ATROVENT) 0.06 % nasal spray Place 2 sprays into both nostrils 4 (four) times daily. (Patient taking differently: Place 2 sprays into both nostrils 4 (four) times daily as needed. ) 15 mL 12  . losartan-hydrochlorothiazide (HYZAAR) 50-12.5 MG per tablet Take 1 tablet by mouth daily. 90 tablet 3  . pantoprazole (PROTONIX) 40 MG tablet TAKE ONE TABLET BY MOUTH DAILY 30 tablet 5  . ranitidine (ZANTAC) 150 MG tablet Take 1 tablet (150 mg total) by mouth at bedtime. 30 tablet 2  . traMADol (ULTRAM) 50 MG tablet Take 1 tablet (50 mg total) by mouth every 6 (six) hours as needed. 8 tablet 0  . nicotine (NICODERM CQ) 21 mg/24hr patch Place 1 patch (21 mg total) onto the skin daily. (Patient not taking: Reported on 10/11/2014) 28 patch 0  . hydrocortisone (ANUSOL-HC) 25 MG suppository Place 1  suppository (25 mg total) rectally at bedtime. (Patient not taking: Reported on 10/11/2014) 14 suppository 0  . Vitamin D, Ergocalciferol, (DRISDOL) 50000 UNITS CAPS capsule Take 1 capsule (50,000 Units total) by mouth every 7 (seven) days. (Patient not taking: Reported on 10/11/2014) 12 capsule 0   No facility-administered medications prior to visit.      Review of Systems  Constitutional: Negative for fever and unexpected weight change.  HENT: Positive for congestion. Negative for dental problem, ear pain, nosebleeds, postnasal drip, rhinorrhea, sinus pressure, sneezing, sore throat and trouble swallowing.   Eyes: Negative for redness and itching.  Respiratory: Positive for cough and shortness of breath. Negative for chest tightness and wheezing.   Cardiovascular: Negative for palpitations and leg swelling.  Gastrointestinal: Negative for nausea and vomiting.  Genitourinary: Negative for dysuria.  Musculoskeletal: Negative for joint swelling.  Skin: Negative for rash.  Neurological: Negative for headaches.  Hematological: Does not bruise/bleed easily.  Psychiatric/Behavioral: Negative for dysphoric mood. The patient is not nervous/anxious.        Objective:   Physical Exam Filed Vitals:   10/11/14 1531  BP: 148/110  Pulse: 108  Height: 5\' 10"  (1.778 m)  Weight: 204 lb (92.534 kg)  SpO2: 97%  RA  Gen: well appearing, no acute distress HENT: NCAT, OP clear, neck supple without masses Eyes: PERRL, EOMi Lymph: no cervical lymphadenopathy PULM: CTA B CV: RRR, no mgr, no JVD GI: BS+, soft, nontender, no hsm Derm: no rash or skin breakdown MSK: normal bulk and tone Neuro: A&Ox4, CN II-XII intact, strength 5/5 in all 4 extremities Psyche: normal mood and affect    Images from a CT abdomen from 2016 were reviewed, lung fields were essentially clear with very minor scarring in the bases in the periphery, but no clear evidence of severe chronic parenchymal lung disease  Office  and procedure records from Dr. Hilarie Fredrickson reviewed, see below     Assessment & Plan:  History of sarcoidosis She has a history of pulmonary sarcoidosis which was diagnosed by lymph node biopsy in 1999 at [facility]. He has been treated off and on with prednisone over the years. He has been experiencing more shortness of breath recently which is in constant in nature. He says it feels like when his sarcoid has flared up in the past.  He does have some wheezing on exam today and so I wonder if some of this is airways disease. He has no history of asthma but certainly sarcoid can cause an asthma-like picture in some folks.  Plan: Symbicort 2 puffs twice a  day Pulmonary function test If no improvement with Symbicort then take prednisone taper Chest x-ray today EKG today Follow-up 4 weeks   Gastritis Recent workup including endoscopy revealed no evidence of GI bleeding or anemia. Presumably his shortness of breath is related to his sarcoidosis.    Smoking Encourage to quit smoking      Current outpatient prescriptions:  .  albuterol (PROVENTIL HFA;VENTOLIN HFA) 108 (90 BASE) MCG/ACT inhaler, Inhale 1-2 puffs into the lungs every 6 (six) hours as needed for wheezing or shortness of breath. FURTHER REFILLS TO COME FROM PCP OR PULMONOLOGIST, Disp: 8 g, Rfl: 0 .  hydrocortisone (ANUSOL-HC) 2.5 % rectal cream, Place 1 application rectally 2 (two) times daily., Disp: 60 g, Rfl: 1 .  ipratropium (ATROVENT) 0.06 % nasal spray, Place 2 sprays into both nostrils 4 (four) times daily. (Patient taking differently: Place 2 sprays into both nostrils 4 (four) times daily as needed. ), Disp: 15 mL, Rfl: 12 .  losartan-hydrochlorothiazide (HYZAAR) 50-12.5 MG per tablet, Take 1 tablet by mouth daily., Disp: 90 tablet, Rfl: 3 .  pantoprazole (PROTONIX) 40 MG tablet, TAKE ONE TABLET BY MOUTH DAILY, Disp: 30 tablet, Rfl: 5 .  ranitidine (ZANTAC) 150 MG tablet, Take 1 tablet (150 mg  total) by mouth at bedtime., Disp: 30 tablet, Rfl: 2 .  traMADol (ULTRAM) 50 MG tablet, Take 1 tablet (50 mg total) by mouth every 6 (six) hours as needed., Disp: 8 tablet, Rfl: 0 .  nicotine (NICODERM CQ) 21 mg/24hr patch, Place 1 patch (21 mg total) onto the skin daily. (Patient not taking: Reported on 10/11/2014), Disp: 28 patch, Rfl: 0 .  predniSONE (DELTASONE) 10 MG tablet, Take 40mg  po daily for 3 days, then take 30mg  po daily for 3 days, then take 20mg  po daily for two days, then take 10mg  po daily for 2 days, Disp: 27 tablet, Rfl: 0 .  Spacer/Aero-Holding Chambers (AEROCHAMBER MV) inhaler, Use as instructed, Disp: 1 each, Rfl: 0

## 2014-10-23 ENCOUNTER — Inpatient Hospital Stay (HOSPITAL_COMMUNITY): Admission: RE | Admit: 2014-10-23 | Payer: Self-pay | Source: Ambulatory Visit

## 2014-10-30 ENCOUNTER — Other Ambulatory Visit: Payer: Self-pay | Admitting: Internal Medicine

## 2014-10-31 ENCOUNTER — Ambulatory Visit (HOSPITAL_COMMUNITY)
Admission: RE | Admit: 2014-10-31 | Discharge: 2014-10-31 | Disposition: A | Discharge status: Home / Self Care | Payer: Self-pay | Source: Ambulatory Visit | Attending: Pulmonary Disease | Admitting: Pulmonary Disease

## 2014-10-31 DIAGNOSIS — D860 Sarcoidosis of lung: Secondary | ICD-10-CM

## 2014-10-31 DIAGNOSIS — K2970 Gastritis, unspecified, without bleeding: Secondary | ICD-10-CM | POA: Insufficient documentation

## 2014-10-31 LAB — PULMONARY FUNCTION TEST
DL/VA % pred: 88 %
DL/VA: 4.11 ml/min/mmHg/L
DLCO unc % pred: 68 %
DLCO unc: 22.19 ml/min/mmHg
FEF 25-75 PRE: 2.94 L/s
FEF 25-75 Post: 3.01 L/sec
FEF2575-%Change-Post: 2 %
FEF2575-%PRED-PRE: 81 %
FEF2575-%Pred-Post: 83 %
FEV1-%CHANGE-POST: 0 %
FEV1-%Pred-Post: 92 %
FEV1-%Pred-Pre: 92 %
FEV1-Post: 3.25 L
FEV1-Pre: 3.23 L
FEV1FVC-%Change-Post: 6 %
FEV1FVC-%Pred-Pre: 96 %
FEV6-%CHANGE-POST: -4 %
FEV6-%PRED-POST: 92 %
FEV6-%Pred-Pre: 96 %
FEV6-POST: 3.9 L
FEV6-Pre: 4.09 L
FEV6FVC-%Change-Post: 0 %
FEV6FVC-%PRED-POST: 101 %
FEV6FVC-%Pred-Pre: 100 %
FVC-%CHANGE-POST: -5 %
FVC-%PRED-PRE: 95 %
FVC-%Pred-Post: 90 %
FVC-Post: 3.91 L
FVC-Pre: 4.13 L
POST FEV6/FVC RATIO: 100 %
Post FEV1/FVC ratio: 83 %
Pre FEV1/FVC ratio: 78 %
Pre FEV6/FVC Ratio: 99 %
RV % pred: 102 %
RV: 1.95 L
TLC % PRED: 85 %
TLC: 5.94 L

## 2014-10-31 MED ORDER — ALBUTEROL SULFATE (2.5 MG/3ML) 0.083% IN NEBU
2.5000 mg | INHALATION_SOLUTION | Freq: Once | RESPIRATORY_TRACT | Status: AC
  Administered 2014-10-31: 2.5 mg via RESPIRATORY_TRACT

## 2014-11-13 ENCOUNTER — Ambulatory Visit: Payer: Self-pay | Admitting: Pulmonary Disease

## 2014-11-14 ENCOUNTER — Ambulatory Visit: Payer: Self-pay | Admitting: Pulmonary Disease

## 2014-11-23 ENCOUNTER — Ambulatory Visit (INDEPENDENT_AMBULATORY_CARE_PROVIDER_SITE_OTHER): Payer: Self-pay | Admitting: Internal Medicine

## 2014-11-23 ENCOUNTER — Encounter: Payer: Self-pay | Admitting: Internal Medicine

## 2014-11-23 VITALS — BP 140/100 | HR 100 | Ht 68.0 in | Wt 202.8 lb

## 2014-11-23 DIAGNOSIS — K648 Other hemorrhoids: Secondary | ICD-10-CM

## 2014-11-23 NOTE — Patient Instructions (Signed)
HEMORRHOID BANDING PROCEDURE    FOLLOW-UP CARE   1. The procedure you have had should have been relatively painless since the banding of the area involved does not have nerve endings and there is no pain sensation.  The rubber band cuts off the blood supply to the hemorrhoid and the band may fall off as soon as 48 hours after the banding (the band may occasionally be seen in the toilet bowl following a bowel movement). You may notice a temporary feeling of fullness in the rectum which should respond adequately to plain Tylenol or Motrin.  2. Following the banding, avoid strenuous exercise that evening and resume full activity the next day.  A sitz bath (soaking in a warm tub) or bidet is soothing, and can be useful for cleansing the area after bowel movements.     3. To avoid constipation, take two tablespoons of natural wheat bran, natural oat bran, flax, Benefiber or any over the counter fiber supplement and increase your water intake to 7-8 glasses daily.    4. Unless you have been prescribed anorectal medication, do not put anything inside your rectum for two weeks: No suppositories, enemas, fingers, etc.  5. Occasionally, you may have more bleeding than usual after the banding procedure.  This is often from the untreated hemorrhoids rather than the treated one.  Don't be concerned if there is a tablespoon or so of blood.  If there is more blood than this, lie flat with your bottom higher than your head and apply an ice pack to the area. If the bleeding does not stop within a half an hour or if you feel faint, call our office at [phone] or go to the emergency room.  6. Problems are not common; however, if there is a substantial amount of bleeding, severe pain, chills, fever or difficulty passing urine (very rare) or other problems, you should call us at [phone] or report to the nearest emergency room.  7. Do not stay seated continuously for more than 2-3 hours for a day or two  after the procedure.  Tighten your buttock muscles 10-15 times every two hours and take 10-15 deep breaths every 1-2 hours.  Do not spend more than a few minutes on the toilet if you cannot empty your bowel; instead re-visit the toilet at a later time.   Your follow up appointment with Dr Rodney Knox is 01/31/2015 at 2:30pm

## 2014-11-23 NOTE — Progress Notes (Signed)
Patient ID: Rodney Knox, male   DOB: 1970/09/21, 44 y.o.   MRN: [id number] PROCEDURE NOTE: Patient returns for banding procedure #3. Initial banding performed in the left lateral, followed by the right posterior internal hemorrhoid on 08/08/2014, 08/31/2014. Initial and primary symptoms were rectal bleeding and intermittent prolapse. Did very well with 1st 2 bandings. He reports bleeding is definitely decreased but 1 week ago he had 4-5 days of painless rectal bleeding after defecation. When he gets stressed he feels his hemorrhoids get worse and this also leads to abdominal cramping and also pain in his left lower back.  The patient presents with symptomatic grade 2-3 hemorrhoids, requesting rubber band ligation of his hemorrhoidal disease. All risks, benefits and alternative forms of therapy were described and informed consent was obtained.   The anorectum was pre-medicated with 0.125% nitroglycerin ointment The decision was made to band the RA (#1 LL, #2 RP) internal hemorrhoid, and the [place] was used to perform band ligation without complication. Digital anorectal examination was then performed to assure proper positioning of the band, and to adjust the banded tissue as required. The patient was discharged home without pain or other issues. Dietary and behavioral recommendations were given and along with follow-up instructions.   The following adjunctive treatments were recommended: Continue Benefiber 1 tablespoon daily  The patient will return in Sept 2016 for follow-up to assess overall treatment response. No complications were encountered and the patient tolerated the procedure well.

## 2015-01-31 ENCOUNTER — Ambulatory Visit: Payer: Self-pay | Admitting: Internal Medicine

## 2015-01-31 ENCOUNTER — Telehealth: Payer: Self-pay | Admitting: Internal Medicine

## 2015-01-31 NOTE — Telephone Encounter (Signed)
Noted  

## 2015-02-09 ENCOUNTER — Other Ambulatory Visit: Payer: Self-pay | Admitting: Internal Medicine

## 2015-03-14 ENCOUNTER — Other Ambulatory Visit: Payer: Self-pay | Admitting: Internal Medicine

## 2015-03-14 NOTE — Telephone Encounter (Signed)
I attempted to reach the patient.  Message says the subscriber you have dialed is not in service.  Will await a return call from the patient

## 2015-04-02 ENCOUNTER — Ambulatory Visit (INDEPENDENT_AMBULATORY_CARE_PROVIDER_SITE_OTHER): Payer: Self-pay | Admitting: Internal Medicine

## 2015-04-02 ENCOUNTER — Encounter: Payer: Self-pay | Admitting: Internal Medicine

## 2015-04-02 VITALS — BP 180/90 | HR 82 | Ht 68.5 in | Wt 200.0 lb

## 2015-04-02 DIAGNOSIS — K219 Gastro-esophageal reflux disease without esophagitis: Secondary | ICD-10-CM

## 2015-04-02 DIAGNOSIS — R140 Abdominal distension (gaseous): Secondary | ICD-10-CM

## 2015-04-02 DIAGNOSIS — K648 Other hemorrhoids: Secondary | ICD-10-CM

## 2015-04-02 NOTE — Patient Instructions (Addendum)
Discontinue Protonix Discontinue Omeprazole Start Florastor 250mg  twice a day Start Lansoprazole 30mg  twice a day 30 minutes before meals  Call if no better in 2 weeks  Otherwise follow up in 4 months     [place]   1. The procedure you have had should have been relatively painless since the banding of the area involved does not have nerve endings and there is no pain sensation.  The rubber band cuts off the blood supply to the hemorrhoid and the band may fall off as soon as 48 hours after the banding (the band may occasionally be seen in the toilet bowl following a bowel movement). You may notice a temporary feeling of fullness in the rectum which should respond adequately to plain Tylenol or Motrin.  2. Following the banding, avoid strenuous exercise that evening and resume full activity the next day.  A sitz bath (soaking in a warm tub) or bidet is soothing, and can be useful for cleansing the area after bowel movements.     3. To avoid constipation, take two tablespoons of natural wheat bran, natural oat bran, flax, Benefiber or any over the counter fiber supplement and increase your water intake to 7-8 glasses daily.    4. Unless you have been prescribed anorectal medication, do not put anything inside your rectum for two weeks: No suppositories, enemas, fingers, etc.  5. Occasionally, you may have more bleeding than usual after the banding procedure.  This is often from the untreated hemorrhoids rather than the treated one.  Don't be concerned if there is a tablespoon or so of blood.  If there is more blood than this, lie flat with your bottom higher than your head and apply an ice pack to the area. If the bleeding does not stop within a half an hour or if you feel faint, call our office at [phone] or go to the emergency room.  6. Problems are not common; however, if there is a substantial amount of bleeding, severe pain, chills, fever  or difficulty passing urine (very rare) or other problems, you should call us at [phone] or report to the nearest emergency room.  7. Do not stay seated continuously for more than 2-3 hours for a day or two after the procedure.  Tighten your buttock muscles 10-15 times every two hours and take 10-15 deep breaths every 1-2 hours.  Do not spend more than a few minutes on the toilet if you cannot empty your bowel; instead re-visit the toilet at a later time.   Call if no better in 2 weeks  Otherwise follow up in 4 months

## 2015-04-02 NOTE — Progress Notes (Signed)
Patient ID: Rodney Knox, male   DOB: 29-Nov-1970, 44 y.o.   MRN: [id number]  Rodney Knox is a 44 year old male with past medical history of GERD, bleeding internal hemorrhoids, adenomatous colon polyps who is here for follow-up. He completed internal hemorrhoid banding by undergoing 3 sessions of banding completing on 11/23/2014. This it helped significantly with bleeding hemorrhoids and prolapsing hemorrhoids.  Unfortunately his bleeding has returned. He notes bright red blood per rectum associated with bowel movement. Blood can at times dripping into the toilet. He is out of Analpram which seem to give him temporary relief. Occasionally when he is having bowel movements or prior to bowel movements he feels lower back pain. This is in the center of his back and seems to get better if he can pass gas or have a bowel movement. He denies tenesmus. He denies diarrhea or constipation. He's having 2-3 bowel movements per day which are soft and did not require straining. Most recently he has had recurrent prolapsing of hemorrhoidal tissue.  He's also had recurrent reflux and heartburn. No dysphagia or odynophagia. He's been taking Protonix on some days and over-the-counter omeprazole and some days. He is also using when necessary ranitidine. He often feels that the reflux causes him to fill bloating after eating.  BP 180/90 mmHg  Pulse 82  Ht 5' 8.5" (1.74 m)  Wt 200 lb (90.719 kg)  BMI 29.96 kg/m2 Constitutional: Well-developed and well-nourished. No distress. HEENT: Normocephalic and atraumatic. Oropharynx is clear and moist. No oropharyngeal exudate. Conjunctivae are normal.  No scleral icterus. Neck: Neck supple. Trachea midline. Cardiovascular: Normal rate, regular rhythm and intact distal pulses. No M/R/G Pulmonary/chest: Effort normal and breath sounds normal. No wheezing, rales or rhonchi. Abdominal: Soft, nontender, nondistended. Bowel sounds active throughout. There are no masses palpable. No  hepatosplenomegaly. Extremities: no clubbing, cyanosis, or edema Lymphadenopathy: No cervical adenopathy noted. Neurological: Alert and oriented to person place and time. Skin: Skin is warm and dry. No rashes noted. Psychiatric: Normal mood and affect. Behavior is normal.  Colonoscopy and endoscopy performed February 2016 was reviewed today including with the patient  In the Left Lateral Decubitus position digital rectal and anoscopic examination revealed grade, no external hemorrhoids, no perianal fluctuance or pain, medium-sized internal hemorrhoids in the left lateral position   PROCEDURE NOTE: The patient presents with symptomatic grade 2-3 internal hemorrhoids, requesting rubber band ligation of his hemorrhoidal disease.  All risks, benefits and alternative forms of therapy were described and informed consent was obtained.   The anorectum was pre-medicated with 0.125% nitroglycerin ointment The decision was made to band the LL internal hemorrhoid, and the [place] was used to perform band ligation without complication.  Digital anorectal examination was then performed to assure proper positioning of the band, and to adjust the banded tissue as required.  The patient was discharged home without pain or other issues.  Dietary and behavioral recommendations were given and along with follow-up instructions.     The following adjunctive treatments were recommended: --Refill Analpram --Call if symptoms fail to resolve and if they do fail to resolve would consider surgical referral for surgical hemorrhoid treatment given 4 prior nonsurgical banding sessions. He was made aware of the surgical option today if he fails to improve  --From a GERD perspective stop all current PPI medications. GERD diet recommended. Begin lansoprazole 30 mg twice a day. --Begin Florastor 250 mg twice a day for gas and bloating --Return in 3 months, sooner if necessary. I will await a  call in 2 weeks to see  how he is doing from hemorrhoidal perspective   No complications were encountered and the patient tolerated the procedure well.

## 2015-04-03 ENCOUNTER — Telehealth: Payer: Self-pay | Admitting: Internal Medicine

## 2015-04-03 MED ORDER — HYDROCORTISONE ACE-PRAMOXINE 2.5-1 % RE CREA: TOPICAL_CREAM | RECTAL | Status: DC

## 2015-04-03 MED ORDER — LANSOPRAZOLE 30 MG PO CPDR: 30.0000 mg | DELAYED_RELEASE_CAPSULE | Freq: Two times a day (BID) | ORAL | Status: DC

## 2015-04-03 NOTE — Telephone Encounter (Signed)
Rx for lansoprazole twice daily and analpram have been sent to the pharmacy.

## 2015-04-16 ENCOUNTER — Telehealth: Payer: Self-pay | Admitting: Internal Medicine

## 2015-04-16 ENCOUNTER — Encounter: Payer: Self-pay | Admitting: Family Medicine

## 2015-04-16 ENCOUNTER — Ambulatory Visit: Payer: Self-pay | Attending: Family Medicine | Admitting: Family Medicine

## 2015-04-16 VITALS — BP 150/93 | HR 82 | Temp 98.7°F | Resp 18 | Ht 68.5 in | Wt 195.0 lb

## 2015-04-16 DIAGNOSIS — R42 Dizziness and giddiness: Secondary | ICD-10-CM

## 2015-04-16 DIAGNOSIS — I10 Essential (primary) hypertension: Secondary | ICD-10-CM

## 2015-04-16 DIAGNOSIS — K2970 Gastritis, unspecified, without bleeding: Secondary | ICD-10-CM

## 2015-04-16 DIAGNOSIS — M545 Low back pain, unspecified: Secondary | ICD-10-CM

## 2015-04-16 DIAGNOSIS — Z0000 Encounter for general adult medical examination without abnormal findings: Secondary | ICD-10-CM

## 2015-04-16 DIAGNOSIS — Z0001 Encounter for general adult medical examination with abnormal findings: Secondary | ICD-10-CM | POA: Insufficient documentation

## 2015-04-16 DIAGNOSIS — Z79899 Other long term (current) drug therapy: Secondary | ICD-10-CM | POA: Insufficient documentation

## 2015-04-16 DIAGNOSIS — G8929 Other chronic pain: Secondary | ICD-10-CM | POA: Insufficient documentation

## 2015-04-16 DIAGNOSIS — D649 Anemia, unspecified: Secondary | ICD-10-CM

## 2015-04-16 DIAGNOSIS — E559 Vitamin D deficiency, unspecified: Secondary | ICD-10-CM

## 2015-04-16 DIAGNOSIS — R0600 Dyspnea, unspecified: Secondary | ICD-10-CM | POA: Insufficient documentation

## 2015-04-16 DIAGNOSIS — K649 Unspecified hemorrhoids: Secondary | ICD-10-CM

## 2015-04-16 DIAGNOSIS — F17210 Nicotine dependence, cigarettes, uncomplicated: Secondary | ICD-10-CM | POA: Insufficient documentation

## 2015-04-16 LAB — POCT URINALYSIS DIPSTICK
Bilirubin, UA: NEGATIVE
Glucose, UA: NEGATIVE
Ketones, UA: NEGATIVE
Leukocytes, UA: NEGATIVE
NITRITE UA: NEGATIVE
PROTEIN UA: NEGATIVE
SPEC GRAV UA: 1.02
UROBILINOGEN UA: 0.2
pH, UA: 6

## 2015-04-16 LAB — CBC
HEMATOCRIT: 25.1 % — AB (ref 39.0–52.0)
HEMOGLOBIN: 7.9 g/dL — AB (ref 13.0–17.0)
MCH: 24 pg — ABNORMAL LOW (ref 26.0–34.0)
MCHC: 31.5 g/dL (ref 30.0–36.0)
MCV: 76.3 fL — ABNORMAL LOW (ref 78.0–100.0)
MPV: 8.3 fL — ABNORMAL LOW (ref 8.6–12.4)
Platelets: 408 10*3/uL — ABNORMAL HIGH (ref 150–400)
RBC: 3.29 MIL/uL — AB (ref 4.22–5.81)
RDW: 17.3 % — ABNORMAL HIGH (ref 11.5–15.5)
WBC: 6.2 10*3/uL (ref 4.0–10.5)

## 2015-04-16 LAB — COMPLETE METABOLIC PANEL WITH GFR
ALK PHOS: 84 U/L (ref 40–115)
ALT: 16 U/L (ref 9–46)
AST: 21 U/L (ref 10–40)
Albumin: 4.2 g/dL (ref 3.6–5.1)
BUN: 11 mg/dL (ref 7–25)
CALCIUM: 9.2 mg/dL (ref 8.6–10.3)
CHLORIDE: 107 mmol/L (ref 98–110)
CO2: 27 mmol/L (ref 20–31)
Creat: 1.07 mg/dL (ref 0.60–1.35)
GFR, EST NON AFRICAN AMERICAN: 84 mL/min (ref >=60)
Glucose, Bld: 80 mg/dL (ref 65–99)
POTASSIUM: 4.8 mmol/L (ref 3.5–5.3)
Sodium: 140 mmol/L (ref 135–146)
Total Bilirubin: 0.3 mg/dL (ref 0.2–1.2)
Total Protein: 7.3 g/dL (ref 6.1–8.1)

## 2015-04-16 LAB — POCT GLYCOSYLATED HEMOGLOBIN (HGB A1C): HEMOGLOBIN A1C: 5.4

## 2015-04-16 LAB — GLUCOSE, POCT (MANUAL RESULT ENTRY): POC Glucose: 85 mg/dl (ref 70–99)

## 2015-04-16 MED ORDER — CYCLOBENZAPRINE HCL 10 MG PO TABS: 10.0000 mg | ORAL_TABLET | Freq: Three times a day (TID) | ORAL | Status: DC | PRN

## 2015-04-16 MED ORDER — DOCUSATE SODIUM 100 MG PO CAPS: 100.0000 mg | ORAL_CAPSULE | Freq: Every day | ORAL | Status: DC | PRN

## 2015-04-16 MED ORDER — LOSARTAN POTASSIUM 25 MG PO TABS: 25.0000 mg | ORAL_TABLET | Freq: Every day | ORAL | Status: DC

## 2015-04-16 MED ORDER — RANITIDINE HCL 150 MG PO TABS: 150.0000 mg | ORAL_TABLET | Freq: Every evening | ORAL | Status: DC | PRN

## 2015-04-16 MED ORDER — HYDROCORTISONE ACE-PRAMOXINE 2.5-1 % RE CREA: TOPICAL_CREAM | RECTAL | Status: DC

## 2015-04-16 NOTE — Telephone Encounter (Signed)
Left message for pt to call back  °

## 2015-04-16 NOTE — Progress Notes (Signed)
Subjective:  Patient ID: Rodney Knox, male    DOB: 07/08/1970  Age: 44 y.o. MRN: [id number]  CC: Back Pain   HPI Nathane Saballos presents for    1. CHRONIC HYPERTENSION  Disease Monitoring  Blood pressure range: not checking   Chest pain: no   Dyspnea: yes at times   Claudication: no   Medication compliance: no, no meds x 4 days  Medication Side Effects  Lightheadedness: yes, upon rising from bending over off and on for past two weeks   Urinary frequency: no   Edema: no   Impotence: yes   2. Chronic low back pain: x 6 months. Low back. Bilateral. At times relieved with bowel movements. No known injury. Pain is described as a spasm. Pain does radiate toward groin. Does not radiate down legs. There is no leg weakness or numbness. He has hx of hemorrhoids. He denies rectal bleeding.   3. Requesting screening: requesting screening HIV.   Social History  Substance Use Topics  . Smoking status: Current Every Day Smoker -- 0.50 packs/day for 23 years    Types: Cigarettes  . Smokeless tobacco: Never Used  . Alcohol Use: 0.0 oz/week    0 Standard drinks or equivalent per week     Comment: occasionally   Outpatient Prescriptions Prior to Visit  Medication Sig Dispense Refill  . albuterol (PROVENTIL HFA;VENTOLIN HFA) 108 (90 BASE) MCG/ACT inhaler Inhale 1-2 puffs into the lungs every 6 (six) hours as needed for wheezing or shortness of breath. FURTHER REFILLS TO COME FROM PCP OR PULMONOLOGIST 8 g 0  . hydrocortisone-pramoxine (ANALPRAM-HC) 2.5-1 % rectal cream PLACE 1 APPLICATION RECTALLY 2 TIMES DAILY. 60 g 1  . ipratropium (ATROVENT) 0.06 % nasal spray Place 2 sprays into both nostrils 4 (four) times daily. (Patient taking differently: Place 2 sprays into both nostrils 4 (four) times daily as needed. ) 15 mL 12  . lansoprazole (PREVACID) 30 MG capsule Take 1 capsule (30 mg total) by mouth 2 (two) times daily. 60 capsule 3  . losartan-hydrochlorothiazide (HYZAAR) 50-12.5 MG per  tablet Take 1 tablet by mouth daily. 90 tablet 3  . nicotine (NICODERM CQ) 21 mg/24hr patch Place 1 patch (21 mg total) onto the skin daily. 28 patch 0  . predniSONE (DELTASONE) 10 MG tablet Take 40mg  po daily for 3 days, then take 30mg  po daily for 3 days, then take 20mg  po daily for two days, then take 10mg  po daily for 2 days 27 tablet 0  . ranitidine (ZANTAC) 150 MG tablet Take 1 tablet (150 mg total) by mouth at bedtime. 30 tablet 2  . Spacer/Aero-Holding Chambers (AEROCHAMBER MV) inhaler Use as instructed 1 each 0  . traMADol (ULTRAM) 50 MG tablet Take 1 tablet (50 mg total) by mouth every 6 (six) hours as needed. 8 tablet 0   No facility-administered medications prior to visit.    ROS Review of Systems  Constitutional: Positive for chills. Negative for fever, fatigue and unexpected weight change.  Eyes: Negative for visual disturbance.  Respiratory: Negative for cough and shortness of breath.   Cardiovascular: Negative for chest pain, palpitations and leg swelling.  Gastrointestinal: Negative for nausea, vomiting, abdominal pain, diarrhea, constipation and blood in stool.  Endocrine: Negative for polydipsia, polyphagia and polyuria.  Genitourinary: Positive for flank pain. Negative for dysuria and hematuria.  Musculoskeletal: Positive for back pain. Negative for myalgias, arthralgias, gait problem and neck pain.  Skin: Negative for rash.  Allergic/Immunologic: Negative for immunocompromised state.  Neurological:  Positive for dizziness and light-headedness.  Hematological: Negative for adenopathy. Does not bruise/bleed easily.  Psychiatric/Behavioral: Negative for suicidal ideas, sleep disturbance and dysphoric mood. The patient is not nervous/anxious.     Objective:  BP 150/93 mmHg  Pulse 82  Temp(Src) 98.7 F (37.1 C) (Oral)  Resp 18  Ht 5' 8.5" (1.74 m)  Wt 195 lb (88.451 kg)  BMI 29.21 kg/m2  SpO2 100% Orthostatic VS for the past 24 hrs:  BP- Lying Pulse- Lying BP-  Sitting Pulse- Sitting BP- Standing at 0 minutes Pulse- Standing at 0 minutes  04/16/15 1503 (!) 151/103 mmHg 66 (!) 152/98 mmHg 70 (!) 161/99 mmHg 66     BP/Weight 04/16/2015 [id number]  Systolic BP [id number]  Diastolic BP 93 90 [id number]  Wt. (Lbs) 195 200 202.8  BMI 29.21 29.96 30.84   Physical Exam  Constitutional: He appears well-developed and well-nourished. No distress.  HENT:  Head: Normocephalic and atraumatic.  Neck: Normal range of motion. Neck supple.  Cardiovascular: Normal rate, regular rhythm, normal heart sounds and intact distal pulses.   Pulmonary/Chest: Effort normal and breath sounds normal.  Musculoskeletal: He exhibits no edema.  Back Exam: Back: Normal Curvature, no deformities or CVA tenderness  Paraspinal Tenderness: b/l lumbar   LE Strength 5/5  LE Sensation: in tact  LE Reflexes 2+ and symmetric  Straight leg raise: negative    Lymphadenopathy:    He has no axillary adenopathy.  Neurological: He is alert.  Skin: Skin is warm and dry. No rash noted. No erythema.  Psychiatric: He has a normal mood and affect.    Assessment & Plan:   Problem List Items Addressed This Visit    Essential hypertension (Chronic)   Relevant Medications   losartan (COZAAR) 25 MG tablet   Other Relevant Orders   COMPLETE METABOLIC PANEL WITH GFR   Gastritis   Relevant Medications   ranitidine (ZANTAC) 150 MG tablet   Hemorrhoid (Chronic)   Relevant Medications   hydrocortisone-pramoxine (ANALPRAM-HC) 2.5-1 % rectal cream   docusate sodium (COLACE) 100 MG capsule   losartan (COZAAR) 25 MG tablet   Low back pain   Relevant Medications   cyclobenzaprine (FLEXERIL) 10 MG tablet   Other Relevant Orders   POCT urinalysis dipstick   DG Lumbar Spine Complete   Vitamin D deficiency (Chronic)   Relevant Orders   Vitamin D, 25-hydroxy    Other Visit Diagnoses    Healthcare maintenance    -  Primary    Relevant Orders    POCT glycosylated hemoglobin (Hb A1C)  (Completed)    POCT glucose (manual entry) (Completed)    Flu Vaccine QUAD 36+ mos IM (Completed)    Dizzy        Relevant Orders    CBC       No orders of the defined types were placed in this encounter.    Follow-up: No Follow-up on file.   Boykin Nearing MD

## 2015-04-16 NOTE — Assessment & Plan Note (Signed)
A: chronic low back pain suspect lumbar muscle spasm with possible rectal spasm P: Lumbar x-ray Muscle relaxer

## 2015-04-16 NOTE — Patient Instructions (Addendum)
Dainel was seen today for back pain and hypertension.  Diagnoses and all orders for this visit:  Healthcare maintenance -     POCT glycosylated hemoglobin (Hb A1C) -     POCT glucose (manual entry) -     Flu Vaccine QUAD 36+ mos IM  Vitamin D deficiency -     Vitamin D, 25-hydroxy  Essential hypertension -     COMPLETE METABOLIC PANEL WITH GFR -     losartan (COZAAR) 25 MG tablet; Take 1 tablet (25 mg total) by mouth daily.  Dizzy -     CBC  Hemorrhoids, unspecified hemorrhoid type -     hydrocortisone-pramoxine (ANALPRAM-HC) 2.5-1 % rectal cream; PLACE 1 APPLICATION RECTALLY 2 TIMES DAILY. -     docusate sodium (COLACE) 100 MG capsule; Take 1 capsule (100 mg total) by mouth daily as needed for mild constipation.  Bilateral low back pain without sciatica -     POCT urinalysis dipstick -     DG Lumbar Spine Complete; Future -     cyclobenzaprine (FLEXERIL) 10 MG tablet; Take 1 tablet (10 mg total) by mouth 3 (three) times daily as needed for muscle spasms.  Gastritis -     ranitidine (ZANTAC) 150 MG tablet; Take 1 tablet (150 mg total) by mouth at bedtime as needed for heartburn.   F/u in 4 weeks week for pharmacy BP check F/u with me in 2 months for back pain  Dr. Adrian Blackwater

## 2015-04-16 NOTE — Telephone Encounter (Signed)
He is likely having some element of anorectal spasm Please check to see if he is moving his bowels regularly without constipation Can try a topical diltiazem gel 2% to the anal canal twice a day for several weeks to see if this helps with discomfort

## 2015-04-16 NOTE — Progress Notes (Signed)
F/U lower  back pain  Pain scale #7 Tobacco user 5 cigarette per day  No thought suicide

## 2015-04-16 NOTE — Telephone Encounter (Signed)
Pt states he saw Dr. Hilarie Fredrickson on 04/02/15 for banding. States that since the banding he has been having pain in his lower back that at times may go down into his groin area. Pt states he had a normal bm today and there was no blood. Does c/o some burning and itching around his rectum. Pt states he is at his PCP now and is going to have some labs done but wanted to know if Dr. Hilarie Fredrickson had any suggestions. Please advise.

## 2015-04-17 DIAGNOSIS — D649 Anemia, unspecified: Secondary | ICD-10-CM | POA: Insufficient documentation

## 2015-04-17 LAB — VITAMIN D 25 HYDROXY (VIT D DEFICIENCY, FRACTURES): Vit D, 25-Hydroxy: 13 ng/mL — ABNORMAL LOW (ref 30–100)

## 2015-04-17 MED ORDER — DILTIAZEM GEL 2 %: 1.0000 "application " | Freq: Two times a day (BID) | CUTANEOUS | Status: AC

## 2015-04-17 MED ORDER — VITAMIN D (ERGOCALCIFEROL) 1.25 MG (50000 UNIT) PO CAPS: 50000.0000 [IU] | ORAL_CAPSULE | ORAL | Status: AC

## 2015-04-17 MED ORDER — FERROUS SULFATE 325 (65 FE) MG PO TABS: 325.0000 mg | ORAL_TABLET | Freq: Two times a day (BID) | ORAL | Status: DC

## 2015-04-17 NOTE — Addendum Note (Signed)
Addended by: Boykin Nearing on: 04/17/2015 02:21 PM   Modules accepted: Orders, SmartSet

## 2015-04-17 NOTE — Telephone Encounter (Signed)
Spoke with pt and he is aware. Script sent to pharmacy. 

## 2015-04-18 ENCOUNTER — Telehealth: Payer: Self-pay | Admitting: *Deleted

## 2015-04-18 NOTE — Telephone Encounter (Signed)
Date of Birth verified by pt Lab results given  Vit D improving but still low  Pt anemic needs F/U labs  Iron and Vit D send to [place]  Pt verbalized understanding  Lab appointment schedule

## 2015-04-18 NOTE — Telephone Encounter (Signed)
-----   Message from Boykin Nearing, MD sent at 04/17/2015  2:15 PM EST ----- Patient is very anemic at 7.9  Vit D improved but still low at 13 Normal CMP- liver and kidney function test   Plan: Vit D 50 K U weekly for 8 weeks Iron F.u labs-iron studies, GI f/u as c

## 2015-04-19 ENCOUNTER — Other Ambulatory Visit: Payer: Self-pay

## 2015-04-20 ENCOUNTER — Other Ambulatory Visit: Payer: Self-pay

## 2015-04-23 ENCOUNTER — Ambulatory Visit: Payer: Self-pay | Attending: Family Medicine

## 2015-04-23 DIAGNOSIS — D649 Anemia, unspecified: Secondary | ICD-10-CM

## 2015-04-23 LAB — CBC
HCT: 27.7 % — ABNORMAL LOW (ref 39.0–52.0)
Hemoglobin: 8.7 g/dL — ABNORMAL LOW (ref 13.0–17.0)
MCH: 24.1 pg — ABNORMAL LOW (ref 26.0–34.0)
MCHC: 31.4 g/dL (ref 30.0–36.0)
MCV: 76.7 fL — AB (ref 78.0–100.0)
MPV: 8.8 fL (ref 8.6–12.4)
PLATELETS: 497 10*3/uL — AB (ref 150–400)
RBC: 3.61 MIL/uL — AB (ref 4.22–5.81)
RDW: 20.3 % — ABNORMAL HIGH (ref 11.5–15.5)
WBC: 7.6 10*3/uL (ref 4.0–10.5)

## 2015-04-23 LAB — IRON AND TIBC
%SAT: 5 % — ABNORMAL LOW (ref 15–60)
Iron: 20 ug/dL — ABNORMAL LOW (ref 50–180)
TIBC: 391 ug/dL (ref 250–425)
UIBC: 371 ug/dL (ref 125–400)

## 2015-04-23 LAB — VITAMIN B12: VITAMIN B 12: 798 pg/mL (ref 211–911)

## 2015-04-23 LAB — LACTATE DEHYDROGENASE: LDH: 169 U/L (ref 94–250)

## 2015-04-23 LAB — FOLATE: FOLATE: 11 ng/mL

## 2015-04-23 LAB — FERRITIN: Ferritin: 18 ng/mL — ABNORMAL LOW (ref 22–322)

## 2015-04-24 ENCOUNTER — Other Ambulatory Visit: Payer: Self-pay | Admitting: Family Medicine

## 2015-04-24 DIAGNOSIS — D649 Anemia, unspecified: Secondary | ICD-10-CM

## 2015-04-24 DIAGNOSIS — D509 Iron deficiency anemia, unspecified: Secondary | ICD-10-CM

## 2015-04-24 LAB — HAPTOGLOBIN: HAPTOGLOBIN: 249 mg/dL — AB (ref 43–212)

## 2015-04-24 MED ORDER — FERROUS SULFATE 325 (65 FE) MG PO TABS: 325.0000 mg | ORAL_TABLET | Freq: Three times a day (TID) | ORAL | Status: AC

## 2015-04-30 ENCOUNTER — Telehealth: Payer: Self-pay

## 2015-04-30 NOTE — Telephone Encounter (Signed)
-----   Message from Boykin Nearing, MD sent at 04/24/2015  9:06 AM EST ----- Iron deficiency anemia Continue iron, increase to three times a day

## 2015-04-30 NOTE — Telephone Encounter (Signed)
Spoke with patient this am  Patient is aware of his lab results and to increase his Iron to three times a day

## 2015-05-08 ENCOUNTER — Encounter: Payer: Self-pay | Admitting: Pharmacist

## 2015-05-15 ENCOUNTER — Other Ambulatory Visit: Payer: Self-pay | Admitting: Internal Medicine

## 2015-05-24 ENCOUNTER — Other Ambulatory Visit: Payer: Self-pay | Admitting: Family Medicine

## 2015-05-24 MED FILL — FERROUS SULFATE 325 MG TAB: 325 (65 FE) | 30 days supply | Qty: 90 | Fill #0

## 2015-05-24 MED FILL — ?LANSOPRAZOLE DR 30MG CAPSU: 30 | 30 days supply | Qty: 60 | Fill #2

## 2015-05-24 MED FILL — HYDROCORT-PRAMOXINE 2.5-1%: 2.5-1 | 7 days supply | Qty: 30 | Fill #1

## 2015-06-11 MED FILL — HYDROCORT-PRAMOXINE 2.5-1%: 2.5-1 | 7 days supply | Qty: 30 | Fill #2

## 2015-06-18 ENCOUNTER — Other Ambulatory Visit: Payer: Self-pay | Admitting: Internal Medicine

## 2015-06-18 NOTE — Telephone Encounter (Signed)
Patient requests refills on diltiazem gel. It appears we gave him this 04/16/15 for suspected anorectal spasm after banding. Do I need to continue to refill or does he need office visit for continued rectal pain?

## 2015-06-19 NOTE — Telephone Encounter (Signed)
Per Dr Hilarie Fredrickson, okay to refill diltiazem. Rx sent to pharmacy.

## 2015-06-25 MED FILL — HYDROCORT-PRAMOXINE 2.5-1%: 2.5-1 | 7 days supply | Qty: 30 | Fill #3

## 2015-06-25 MED FILL — ?LANSOPRAZOLE DR 30MG CAPSU: 30 | 30 days supply | Qty: 60 | Fill #3

## 2015-06-25 MED FILL — raNITIdine HCL 150 MG TABS: 150 | 30 days supply | Qty: 30 | Fill #1

## 2015-07-11 MED FILL — HYDROCORT-PRAMOXINE 2.5-1%: 2.5-1 | 15 days supply | Qty: 30 | Fill #1

## 2015-07-13 ENCOUNTER — Other Ambulatory Visit: Payer: Self-pay | Admitting: Family Medicine

## 2015-07-13 MED FILL — ?LANSOPRAZOLE DR 30MG CAPSU: 30 | 14 days supply | Qty: 28 | Fill #4

## 2015-07-30 ENCOUNTER — Other Ambulatory Visit: Payer: Self-pay | Admitting: Internal Medicine

## 2015-07-30 MED FILL — ?LANSOPRAZOLE DR 30MG CAPSU: 30 | 30 days supply | Qty: 60 | Fill #0

## 2015-07-30 MED FILL — HYDROCORT-PRAMOXINE 2.5-1%: 2.5-1 | 15 days supply | Qty: 30 | Fill #2

## 2015-08-02 ENCOUNTER — Emergency Department (HOSPITAL_COMMUNITY)
Admission: EM | Admit: 2015-08-02 | Discharge: 2015-08-02 | Disposition: A | Discharge status: Home / Self Care | Payer: Self-pay | Attending: Emergency Medicine | Admitting: Emergency Medicine

## 2015-08-02 ENCOUNTER — Emergency Department (HOSPITAL_COMMUNITY): Payer: Self-pay

## 2015-08-02 ENCOUNTER — Encounter (HOSPITAL_COMMUNITY): Payer: Self-pay | Admitting: Emergency Medicine

## 2015-08-02 DIAGNOSIS — M791 Myalgia: Secondary | ICD-10-CM | POA: Insufficient documentation

## 2015-08-02 DIAGNOSIS — J029 Acute pharyngitis, unspecified: Secondary | ICD-10-CM | POA: Insufficient documentation

## 2015-08-02 DIAGNOSIS — R05 Cough: Secondary | ICD-10-CM | POA: Insufficient documentation

## 2015-08-02 DIAGNOSIS — K219 Gastro-esophageal reflux disease without esophagitis: Secondary | ICD-10-CM | POA: Insufficient documentation

## 2015-08-02 DIAGNOSIS — Z79899 Other long term (current) drug therapy: Secondary | ICD-10-CM | POA: Insufficient documentation

## 2015-08-02 DIAGNOSIS — Z862 Personal history of diseases of the blood and blood-forming organs and certain disorders involving the immune mechanism: Secondary | ICD-10-CM | POA: Insufficient documentation

## 2015-08-02 DIAGNOSIS — R51 Headache: Secondary | ICD-10-CM | POA: Insufficient documentation

## 2015-08-02 DIAGNOSIS — Z86018 Personal history of other benign neoplasm: Secondary | ICD-10-CM | POA: Insufficient documentation

## 2015-08-02 DIAGNOSIS — R0981 Nasal congestion: Secondary | ICD-10-CM | POA: Insufficient documentation

## 2015-08-02 DIAGNOSIS — R110 Nausea: Secondary | ICD-10-CM | POA: Insufficient documentation

## 2015-08-02 DIAGNOSIS — I10 Essential (primary) hypertension: Secondary | ICD-10-CM | POA: Insufficient documentation

## 2015-08-02 DIAGNOSIS — R109 Unspecified abdominal pain: Secondary | ICD-10-CM | POA: Insufficient documentation

## 2015-08-02 DIAGNOSIS — R062 Wheezing: Secondary | ICD-10-CM | POA: Insufficient documentation

## 2015-08-02 DIAGNOSIS — R509 Fever, unspecified: Secondary | ICD-10-CM | POA: Insufficient documentation

## 2015-08-02 DIAGNOSIS — Q6100 Congenital renal cyst, unspecified: Secondary | ICD-10-CM | POA: Insufficient documentation

## 2015-08-02 DIAGNOSIS — R197 Diarrhea, unspecified: Secondary | ICD-10-CM | POA: Insufficient documentation

## 2015-08-02 DIAGNOSIS — R6889 Other general symptoms and signs: Secondary | ICD-10-CM

## 2015-08-02 DIAGNOSIS — F17210 Nicotine dependence, cigarettes, uncomplicated: Secondary | ICD-10-CM | POA: Insufficient documentation

## 2015-08-02 MED ORDER — ONDANSETRON 8 MG PO TBDP
8.0000 mg | ORAL_TABLET | Freq: Once | ORAL | Status: AC
  Administered 2015-08-02: 8 mg via ORAL
  Filled 2015-08-02: qty 1

## 2015-08-02 MED ORDER — BENZONATATE 100 MG PO CAPS: 100.0000 mg | ORAL_CAPSULE | Freq: Three times a day (TID) | ORAL | Status: DC

## 2015-08-02 MED ORDER — OSELTAMIVIR PHOSPHATE 75 MG PO CAPS
75.0000 mg | ORAL_CAPSULE | Freq: Once | ORAL | Status: AC
  Administered 2015-08-02: 75 mg via ORAL
  Filled 2015-08-02: qty 1

## 2015-08-02 MED ORDER — PREDNISONE 20 MG PO TABS
60.0000 mg | ORAL_TABLET | Freq: Once | ORAL | Status: AC
  Administered 2015-08-02: 60 mg via ORAL
  Filled 2015-08-02: qty 3

## 2015-08-02 MED ORDER — OSELTAMIVIR PHOSPHATE 75 MG PO CAPS: 75.0000 mg | ORAL_CAPSULE | Freq: Two times a day (BID) | ORAL | Status: AC

## 2015-08-02 MED ORDER — ACETAMINOPHEN 325 MG PO TABS
650.0000 mg | ORAL_TABLET | Freq: Once | ORAL | Status: AC | PRN
  Administered 2015-08-02: 650 mg via ORAL
  Filled 2015-08-02: qty 2

## 2015-08-02 MED ORDER — PREDNISONE 10 MG PO TABS: ORAL_TABLET | ORAL | Status: AC

## 2015-08-02 MED ORDER — KETOROLAC TROMETHAMINE 60 MG/2ML IM SOLN
60.0000 mg | Freq: Once | INTRAMUSCULAR | Status: AC
  Administered 2015-08-02: 60 mg via INTRAMUSCULAR
  Filled 2015-08-02: qty 2

## 2015-08-02 NOTE — Discharge Instructions (Signed)
Take ibuprofen and tylenol on regular basis to keep temperature down. Drink plenty of fluids. Tamiflu as prescribed. Tessalon for cough. Prednisone until all gone. Follow up with your doctor. Return if worsening symptoms.   Influenza, Adult Influenza ("the flu") is a viral infection of the respiratory tract. It occurs more often in winter months because people spend more time in close contact with one another. Influenza can make you feel very sick. Influenza easily spreads from person to person (contagious). CAUSES  Influenza is caused by a virus that infects the respiratory tract. You can catch the virus by breathing in droplets from an infected person's cough or sneeze. You can also catch the virus by touching something that was recently contaminated with the virus and then touching your mouth, nose, or eyes. RISKS AND COMPLICATIONS You may be at risk for a more severe case of influenza if you smoke cigarettes, have diabetes, have chronic heart disease (such as heart failure) or lung disease (such as asthma), or if you have a weakened immune system. Elderly people and pregnant women are also at risk for more serious infections. The most common problem of influenza is a lung infection (pneumonia). Sometimes, this problem can require emergency medical care and may be life threatening. SIGNS AND SYMPTOMS  Symptoms typically last 4 to 10 days and may include:  Fever.  Chills.  Headache, body aches, and muscle aches.  Sore throat.  Chest discomfort and cough.  Poor appetite.  Weakness or feeling tired.  Dizziness.  Nausea or vomiting. DIAGNOSIS  Diagnosis of influenza is often made based on your history and a physical exam. A nose or throat swab test can be done to confirm the diagnosis. TREATMENT  In mild cases, influenza goes away on its own. Treatment is directed at relieving symptoms. For more severe cases, your health care provider may prescribe antiviral medicines to shorten the  sickness. Antibiotic medicines are not effective because the infection is caused by a virus, not by bacteria. HOME CARE INSTRUCTIONS  Take medicines only as directed by your health care provider.  Use a cool mist humidifier to make breathing easier.  Get plenty of rest until your temperature returns to normal. This usually takes 3 to 4 days.  Drink enough fluid to keep your urine clear or pale yellow.  Cover yourmouth and nosewhen coughing or sneezing,and wash your handswellto prevent thevirusfrom spreading.  Stay homefromwork orschool untilthe fever is gonefor at least 31full day. PREVENTION  An annual influenza vaccination (flu shot) is the best way to avoid getting influenza. An annual flu shot is now routinely recommended for all adults in the [place] IF:  You experiencechest pain, yourcough worsens,or you producemore mucus.  Youhave nausea,vomiting, ordiarrhea.  Your fever returns or gets worse. SEEK IMMEDIATE MEDICAL CARE IF:  You havetrouble breathing, you become short of breath,or your skin ornails becomebluish.  You have severe painor stiffnessin the neck.  You develop a sudden headache, or pain in the face or ear.  You have nausea or vomiting that you cannot control. MAKE SURE YOU:   Understand these instructions.  Will watch your condition.  Will get help right away if you are not doing well or get worse.   This information is not intended to replace advice given to you by your health care provider. Make sure you discuss any questions you have with your health care provider.   Document Released: 05/02/2000 Document Revised: 05/26/2014 Document Reviewed: 08/04/2011 Elsevier Interactive Patient Education 2016 Elsevier  Inc. ° °

## 2015-08-02 NOTE — ED Notes (Signed)
Pt states he started having a cough on Monday  Tuesday states he was light headed, dizzy, runny nose, congestion, chills, and general body aches  Pt states he has a headache on Wednesday and nausea

## 2015-08-02 NOTE — ED Provider Notes (Signed)
CSN: [id number]     Arrival date & time 08/02/15  [id number] History   First MD Initiated Contact with Patient 08/02/15 [phone]     Chief Complaint  Patient presents with  . Influenza     (Consider location/radiation/quality/duration/timing/severity/associated sxs/prior Treatment) HPI Rodney Knox is a 45 y.o. male history of hypertension, sarcoidosis, GERD, presents to emergency department complaining of fever, chills, sore throat, nasal congestion, body aches, cough, nausea, diarrhea. Patient states symptoms started yesterday. Patient states that he has been taking TheraFlu over-the-counter with no improvement of his symptoms. Patient states he is able to drink, however had no appetite and did not eat anything today. Patient states he feels weak. Patient reports that his cough is productive with thick white sputum. He denies any blood in his sputum. He denies any neck pain or stiffness. He denies any photophobia. Denies any recent ill contacts.  Past Medical History  Diagnosis Date  . Hypertension   . Sarcoidosis ([place])   . Internal hemorrhoids   . GERD (gastroesophageal reflux disease)   . Periumbilical hernia   . Renal cyst   . Hiatal hernia   . Tubular adenoma of colon    Past Surgical History  Procedure Laterality Date  . Chest tube insertion  2002   Family History  Problem Relation Age of Onset  . Colon cancer Neg Hx   . Colon polyps Neg Hx   . Kidney disease Neg Hx   . Esophageal cancer Neg Hx   . Gallbladder disease Neg Hx   . Diabetes Father   . Diabetes Paternal Grandmother   . Diabetes Paternal Uncle    Social History  Substance Use Topics  . Smoking status: Current Every Day Smoker -- 0.50 packs/day for 23 years    Types: Cigarettes  . Smokeless tobacco: Never Used  . Alcohol Use: No    Review of Systems  Constitutional: Positive for fever and chills.  HENT: Positive for congestion and sore throat.   Eyes: Negative for photophobia.  Respiratory: Positive for  cough and wheezing. Negative for chest tightness and shortness of breath.   Cardiovascular: Negative for chest pain, palpitations and leg swelling.  Gastrointestinal: Positive for nausea, abdominal pain and diarrhea. Negative for vomiting and abdominal distention.  Genitourinary: Negative for dysuria, urgency, frequency and hematuria.  Musculoskeletal: Positive for myalgias. Negative for neck pain and neck stiffness.  Skin: Negative for rash.  Neurological: Positive for headaches. Negative for dizziness, weakness, light-headedness and numbness.  All other systems reviewed and are negative.     Allergies  Review of patient's allergies indicates no known allergies.  Home Medications   Prior to Admission medications   Medication Sig Start Date End Date Taking? Authorizing Provider  albuterol (PROVENTIL HFA;VENTOLIN HFA) 108 (90 BASE) MCG/ACT inhaler Inhale 1-2 puffs into the lungs every 6 (six) hours as needed for wheezing or shortness of breath. FURTHER REFILLS TO COME FROM PCP OR PULMONOLOGIST 08/31/14   Jerene Bears, MD  cyclobenzaprine (FLEXERIL) 10 MG tablet Take 1 tablet (10 mg total) by mouth 3 (three) times daily as needed for muscle spasms. 04/16/15   Josalyn Funches, MD  diltiazem 2 % GEL Apply 1 application topically 2 (two) times daily. 04/17/15   Jerene Bears, MD  DiltiaZEM HCl POWD APPLY RECTALLY TWICE A DAY AS DIRECTED. 06/18/15   Jerene Bears, MD  docusate sodium (COLACE) 100 MG capsule Take 1 capsule (100 mg total) by mouth daily as needed for mild constipation. 04/16/15  Boykin Nearing, MD  ferrous sulfate 325 (65 FE) MG tablet Take 1 tablet (325 mg total) by mouth 3 (three) times daily with meals. 04/24/15   Josalyn Funches, MD  hydrocortisone-pramoxine (ANALPRAM-HC) 2.5-1 % rectal cream PLACE 1 APPLICATION RECTALLY 2 TIMES DAILY. 04/16/15   Josalyn Funches, MD  ipratropium (ATROVENT) 0.06 % nasal spray Place 2 sprays into both nostrils 4 (four) times daily. Patient not  taking: Reported on 04/16/2015 05/25/14   Waldemar Dickens, MD  lansoprazole (PREVACID) 30 MG capsule TAKE 1 CAPSULE BY MOUTH 2 TIMES DAILY. 07/30/15   Jerene Bears, MD  losartan (COZAAR) 25 MG tablet Take 1 tablet (25 mg total) by mouth daily. 04/16/15   Josalyn Funches, MD  ranitidine (ZANTAC) 150 MG tablet Take 1 tablet (150 mg total) by mouth at bedtime as needed for heartburn. 04/16/15   Boykin Nearing, MD  Spacer/Aero-Holding Chambers (AEROCHAMBER MV) inhaler Use as instructed 10/11/14   Juanito Doom, MD  Vitamin D, Ergocalciferol, (DRISDOL) 50000 UNITS CAPS capsule Take 1 capsule (50,000 Units total) by mouth every 7 (seven) days. For 8 weeks 04/17/15   Josalyn Funches, MD   BP 145/112 mmHg  Pulse 113  Temp(Src) 100.8 F (38.2 C) (Oral)  Resp 18  SpO2 96% Physical Exam  Constitutional: He is oriented to person, place, and time. He appears well-developed and well-nourished. No distress.  HENT:  Head: Normocephalic and atraumatic.  Right Ear: Tympanic membrane, external ear and ear canal normal.  Left Ear: Tympanic membrane, external ear and ear canal normal.  Nose: Mucosal edema present.  Mouth/Throat: Uvula is midline. Posterior oropharyngeal erythema present. No oropharyngeal exudate or posterior oropharyngeal edema.  Eyes: Conjunctivae are normal.  Neck: Normal range of motion. Neck supple.  No meningismus  Cardiovascular: Normal rate, regular rhythm and normal heart sounds.   Pulmonary/Chest: Effort normal. No respiratory distress. He has no wheezes. He has no rales.  Abdominal: Soft. Bowel sounds are normal. He exhibits no distension. There is no tenderness. There is no rebound and no guarding.  Musculoskeletal: He exhibits no edema.  Neurological: He is alert and oriented to person, place, and time.  Skin: Skin is warm and dry.  Nursing note and vitals reviewed.   ED Course  Procedures (including critical care time) Labs Review Labs Reviewed - No data to  display  Imaging Review Dg Chest 2 View  08/02/2015  CLINICAL DATA:  Cough, fever, body aches for 1 week EXAM: CHEST  2 VIEW COMPARISON:  10/11/2014 FINDINGS: Normal heart size and mediastinal contours. Borderline bronchitic markings without pneumonia or edema. No effusion or pneumothorax. No acute osseous findings. IMPRESSION: 1. Negative pneumonia. 2. Possible bronchitis. Electronically Signed   By: Monte Fantasia M.D.   On: 08/02/2015 03:51   I have personally reviewed and evaluated these images and lab results as part of my medical decision-making.   EKG Interpretation None      MDM   Final diagnoses:  Flu-like symptoms    Pt with flu like symptoms onset two days ago, worsened yesterday. Temp 100.8, HR 113, BP normal. No respiratory distress. Coughing. Will get CXR, toradol and tylenol ordered.   4:46 AM CXR negative. Patient feels better after Tylenol and Toradol. I did also order prednisone for wheezing and Tamiflu given patient does have history of sarcoidosis. Patient's vital signs improved, heart rate is between high 90s to low 100s. His respiratory rate is normal. Blood pressure is normal. Oxygen saturation 98 percent on room air. Temperature down  to 100.1. Patient states he feels much better and ready to go home. We'll discharge home with albuterol, prednisone, Tamiflu, Tessalon. Follow-up with primary care doctor. Return precautions discussed.   Filed Vitals:   08/02/15 0323 08/02/15 0427  BP: 145/112 116/71  Pulse: 113 106  Temp: 100.8 F (38.2 C) 100.1 F (37.8 C)  TempSrc: Oral Oral  Resp: 18 22  SpO2: 96% 98%       Jeannett Senior, PA-C 08/02/15 0448  Shanon Rosser, MD 08/02/15 [phone]

## 2015-08-28 MED FILL — ?LANSOPRAZOLE DR 30MG CAPSU: 30 | 30 days supply | Qty: 60 | Fill #1

## 2015-08-28 MED FILL — HYDROCORT-PRAMOXINE 2.5-1%: 2.5-1 | 15 days supply | Qty: 30 | Fill #3

## 2015-08-28 MED FILL — raNITIdine HCL 150 MG TABS: 150 | 30 days supply | Qty: 30 | Fill #2

## 2015-09-21 MED FILL — HYDROCORT-PRAMOXINE 2.5-1%: 2.5-1 | 15 days supply | Qty: 30 | Fill #1

## 2015-09-21 MED FILL — ?LANSOPRAZOLE DR 30MG CAPSU: 30 | 30 days supply | Qty: 60 | Fill #2

## 2015-10-08 ENCOUNTER — Other Ambulatory Visit: Payer: Self-pay | Admitting: Internal Medicine

## 2015-10-11 ENCOUNTER — Other Ambulatory Visit: Payer: Self-pay | Admitting: Internal Medicine

## 2015-10-11 ENCOUNTER — Telehealth: Payer: Self-pay | Admitting: Internal Medicine

## 2015-10-11 DIAGNOSIS — K649 Unspecified hemorrhoids: Secondary | ICD-10-CM

## 2015-10-11 MED ORDER — HYDROCORTISONE ACE-PRAMOXINE 2.5-1 % RE CREA: TOPICAL_CREAM | RECTAL | Status: DC

## 2015-10-11 MED FILL — ?LANSOPRAZOLE DR 30MG CAPSU: 30 | 30 days supply | Qty: 60 | Fill #3

## 2015-10-11 MED FILL — raNITIdine HCL 150 MG TABS: 150 | 30 days supply | Qty: 30 | Fill #3

## 2015-10-11 MED FILL — HYDROCORT-PRAMOXINE 2.5-1%: 2.5-1 | 15 days supply | Qty: 60 | Fill #0

## 2015-10-11 NOTE — Telephone Encounter (Signed)
Keep office visit for further refills

## 2015-10-11 NOTE — Telephone Encounter (Signed)
See previous phone note for correspondence.

## 2015-10-29 ENCOUNTER — Encounter: Payer: Self-pay | Admitting: *Deleted

## 2015-10-30 ENCOUNTER — Ambulatory Visit: Payer: Self-pay | Admitting: Gastroenterology

## 2015-11-16 ENCOUNTER — Other Ambulatory Visit: Payer: Self-pay | Admitting: Internal Medicine

## 2015-11-16 ENCOUNTER — Telehealth: Payer: Self-pay | Admitting: Internal Medicine

## 2015-11-16 DIAGNOSIS — K649 Unspecified hemorrhoids: Secondary | ICD-10-CM

## 2015-11-16 MED ORDER — HYDROCORTISONE ACE-PRAMOXINE 2.5-1 % RE CREA: TOPICAL_CREAM | RECTAL | Status: DC

## 2015-11-16 NOTE — Telephone Encounter (Signed)
Rx sent. Patient MUST keep 01/08/16 appointment or no further refills will be provided.

## 2015-11-23 ENCOUNTER — Other Ambulatory Visit: Payer: Self-pay | Admitting: Family Medicine

## 2015-11-23 MED FILL — HYDROCORT-PRAMOXINE 2.5-1%: 2.5-1 | 15 days supply | Qty: 30 | Fill #0

## 2015-11-23 MED FILL — raNITIdine HCL 150 MG TABS: 150 | 30 days supply | Qty: 30 | Fill #0

## 2015-11-23 MED FILL — ?LANSOPRAZOLE DR 30MG CAPSU: 30 | 30 days supply | Qty: 60 | Fill #0

## 2015-12-25 MED FILL — LANSOPRAZOLE DR 30 MG CAPSU: 30 | 30 days supply | Qty: 60 | Fill #1

## 2015-12-25 MED FILL — HYDROCORT-PRAMOXINE 2.5-1%: 2.5-1 | 15 days supply | Qty: 30 | Fill #1

## 2015-12-25 MED FILL — LOSARTAN POTASSIUM 25 MG TA: 25 | 30 days supply | Qty: 30 | Fill #1

## 2016-01-08 ENCOUNTER — Encounter: Payer: Self-pay | Admitting: Internal Medicine

## 2016-01-08 ENCOUNTER — Ambulatory Visit (INDEPENDENT_AMBULATORY_CARE_PROVIDER_SITE_OTHER): Payer: Self-pay | Admitting: Internal Medicine

## 2016-01-08 VITALS — BP 172/110 | HR 80 | Ht 68.5 in | Wt 196.1 lb

## 2016-01-08 DIAGNOSIS — K2100 Gastro-esophageal reflux disease with esophagitis, without bleeding: Secondary | ICD-10-CM

## 2016-01-08 DIAGNOSIS — K649 Unspecified hemorrhoids: Secondary | ICD-10-CM

## 2016-01-08 DIAGNOSIS — Z86010 Personal history of colonic polyps: Secondary | ICD-10-CM

## 2016-01-08 MED ORDER — RANITIDINE HCL 150 MG PO TABS: 150.0000 mg | ORAL_TABLET | Freq: Every evening | ORAL | 0 refills | Status: DC | PRN

## 2016-01-08 MED ORDER — HYDROCORTISONE ACE-PRAMOXINE 2.5-1 % RE CREA
TOPICAL_CREAM | RECTAL | 5 refills | Status: DC
Start: 2016-01-08 — End: 2016-06-16

## 2016-01-08 MED ORDER — LANSOPRAZOLE 30 MG PO CPDR: DELAYED_RELEASE_CAPSULE | ORAL | 10 refills | Status: DC

## 2016-01-08 NOTE — Patient Instructions (Signed)
We have sent the following medications to your pharmacy for you to pick up at your convenience: Analpram Lansoprazole twice daily Zantac as needed  Please follow up with Dr Hilarie Fredrickson in 2 years.  If you are age 45 or older, your body mass index should be between 23-30. Your Body mass index is 29.39 kg/m. If this is out of the aforementioned range listed, please consider follow up with your Primary Care Provider.  If you are age 22 or younger, your body mass index should be between 19-25. Your Body mass index is 29.39 kg/m. If this is out of the aformentioned range listed, please consider follow up with your Primary Care Provider.

## 2016-01-08 NOTE — Progress Notes (Signed)
Subjective:    Patient ID: Rodney Knox, male    DOB: 26-Aug-1970, 45 y.o.   MRN: [id number]  HPI [name] is a 45 year old male with a past medical history of bleeding internal hemorrhoids, adenomatous colon polyps and GERD who is here for follow-up. He was last seen in the office in November 2016. He completed prior internal hemorrhoidal banding with improvement in hemorrhoidal bleeding. Today he reports he is doing very well. He does occasionally have swelling hemorrhoidal tissue after working all day/standing all day. He has not had any further rectal bleeding or difficulty with defecation. He is using Analpram on an as-needed basis for the hemorrhoidal swelling. This works very well for him. He continues probiotic which is helped regulate bowel movements. He's having a bowel movement daily again without blood and he denies melena. No abdominal pain. Lansoprazole 30 mg 3 times a day controls GERD and reflux symptoms dramatically. He tried to decrease to once daily but had breakthrough evening heartburn. He occasionally uses Zantac 150 milligrams late in the evening if he has breakthrough heartburn. Prior endoscopy in February 2016 showed reflux esophagitis and a small hiatal hernia.   Review of Systems As per history of present illness, otherwise negative  Current Medications, Allergies, Past Medical History, Past Surgical History, Family History and Social History were reviewed in [employer] record.     Objective:   Physical Exam BP (!) 172/110 (BP Location: Left Arm, Patient Position: Sitting, Cuff Size: Normal)   Pulse 80   Ht 5' 8.5" (1.74 m)   Wt 196 lb 2 oz (89 kg)   BMI 29.39 kg/m  Constitutional: Well-developed and well-nourished. No distress. HEENT: Normocephalic and atraumatic. No oropharyngeal exudate. Conjunctivae are normal.  No scleral icterus. Neck: Neck supple. Trachea midline. Cardiovascular: Normal rate, regular rhythm and intact distal  pulses. No M/R/G Pulmonary/chest: Effort normal and breath sounds normal. No wheezing, rales or rhonchi. Abdominal: Soft, nontender, nondistended. Bowel sounds active throughout. There are no masses palpable. No hepatosplenomegaly. Extremities: no clubbing, cyanosis, or edema Neurological: Alert and oriented to person place and time. Skin: Skin is warm and dry. Psychiatric: Normal mood and affect. Behavior is normal.      Assessment & Plan:   45 year old male with a past medical history of bleeding internal hemorrhoids, adenomatous colon polyps and GERD who is here for follow-up.  1. Hemorrhoids -- considerable improvement in internal hemorrhoidal symptoms, specifically bleeding after completing hemorrhoidal banding therapy. He still has some minor hemorrhoidal swelling which is relieved with Analpram. He can use Analpram for 3-5 days periodically on an as-needed basis. If hemorrhoidal bleeding returns or uncontrolled hemorrhoidal symptoms, he is asked to notify me. He voices understanding  2. GERD with history of esophagitis -- symptoms control with lansoprazole 30 mg twice a day before meals. Attempt to decrease to once daily resulted in breakthrough heartburn. GERD diet and lifestyle reviewed. Continue lansoprazole 30 mg twice a day before meals. Can use ranitidine 150 mg on an as-needed basis for breakthrough heartburn. No evidence of Barrett's esophagus at prior endoscopy.  3. History of adenomatous colon polyps. -- Repeat colonoscopy recommended in February 2019. He was made aware of this recommendation today.  He can return in 2 years, sooner if needed. He is now living in [place] but wishes to maintain GI care here and will notify me of any issues before follow-up 25 minutes spent with the patient today. Greater than 50% was spent in counseling and coordination of care with  the patient

## 2016-01-24 MED FILL — LOSARTAN POTASSIUM 25 MG TA: 25 | 30 days supply | Qty: 30 | Fill #2

## 2016-01-24 MED FILL — LANSOPRAZOLE DR 30 MG CAPSU: 30 | 30 days supply | Qty: 60 | Fill #0

## 2016-01-24 MED FILL — HYDROCORT-PRAMOXINE 2.5%-1%: 2.5-1 | 30 days supply | Qty: 60 | Fill #0

## 2016-01-24 MED FILL — raNITIdine HCL 150 MG TABS: 150 | 30 days supply | Qty: 30 | Fill #0

## 2016-02-25 ENCOUNTER — Other Ambulatory Visit: Payer: Self-pay | Admitting: Internal Medicine

## 2016-02-25 ENCOUNTER — Other Ambulatory Visit: Payer: Self-pay | Admitting: Family Medicine

## 2016-02-25 DIAGNOSIS — I10 Essential (primary) hypertension: Secondary | ICD-10-CM

## 2016-02-25 MED FILL — HYDROCORT-PRAMOXINE 2.5%-1%: 2.5-1 | 30 days supply | Qty: 60 | Fill #1

## 2016-02-25 MED FILL — raNITIdine HCL 150 MG TABS: 150 | 30 days supply | Qty: 30 | Fill #0

## 2016-02-25 MED FILL — ?LANSOPRAZOLE DR 30MG CAPSU: 30 | 30 days supply | Qty: 60 | Fill #1

## 2016-02-25 MED FILL — LOSARTAN POTASSIUM 25 MG TA: 25 | 30 days supply | Qty: 30 | Fill #0

## 2016-03-25 ENCOUNTER — Other Ambulatory Visit: Payer: Self-pay | Admitting: Family Medicine

## 2016-03-25 DIAGNOSIS — I10 Essential (primary) hypertension: Secondary | ICD-10-CM

## 2016-03-25 MED FILL — HYDROCORT-PRAMOXINE 2.5%-1%: 2.5-1 | 30 days supply | Qty: 60 | Fill #2

## 2016-03-25 MED FILL — ?LANSOPRAZOLE DR 30MG CAPSU: 30 | 30 days supply | Qty: 60 | Fill #2

## 2016-03-31 ENCOUNTER — Other Ambulatory Visit: Payer: Self-pay | Admitting: Family Medicine

## 2016-03-31 DIAGNOSIS — I10 Essential (primary) hypertension: Secondary | ICD-10-CM

## 2016-03-31 IMAGING — CT CT ABD-PELV W/ CM
2 of 5 series · 16 of 46 positions shown, 18 images · IV contrast (omnipaque)
Comparison: None.

CLINICAL DATA: 43-year-old with upper abdominal pain for 2 days.
Pain radiates to the left flank and back.

EXAM:
CT ABDOMEN AND PELVIS WITH CONTRAST
TECHNIQUE: Multidetector CT imaging of the abdomen and pelvis was performed
using the standard protocol following bolus administration of
intravenous contrast.
CONTRAST:  100mL OMNIPAQUE IOHEXOL 300 MG/ML  SOLN

[Series 2: abd/ pelvis 5.0 i30f 1 · axial · 0.87mm/px · z∈[-688,-294]mm · 13 of 89 slices shown, 15 images]
[im 5/89  soft-tissue]
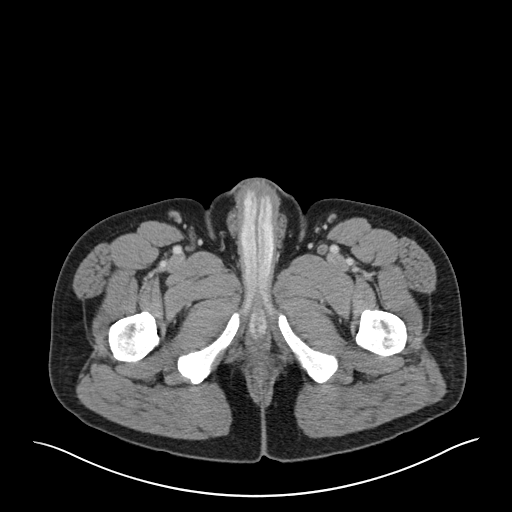
[im 5/89  bone]
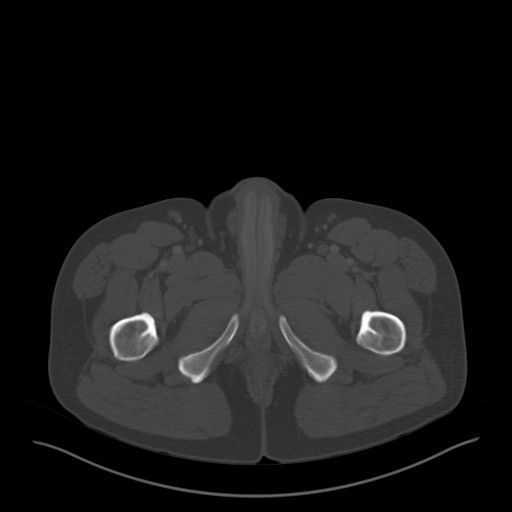
[im 14/89  soft-tissue]
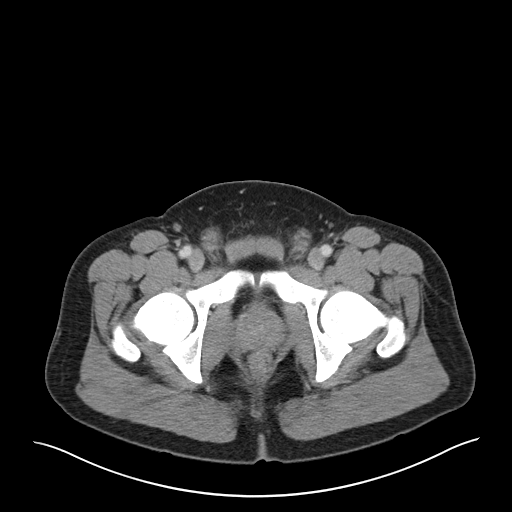
[im 18/89  soft-tissue]
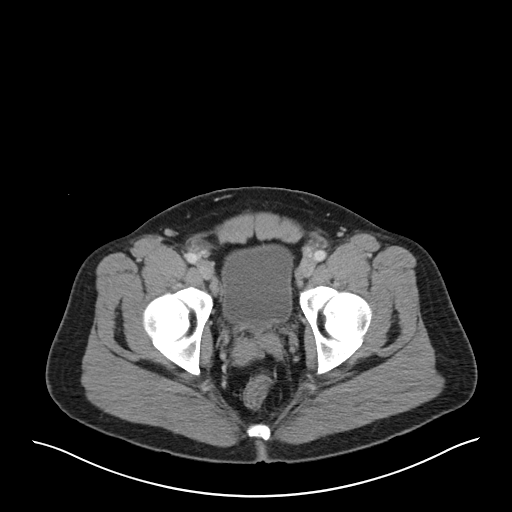
[im 27/89  soft-tissue]
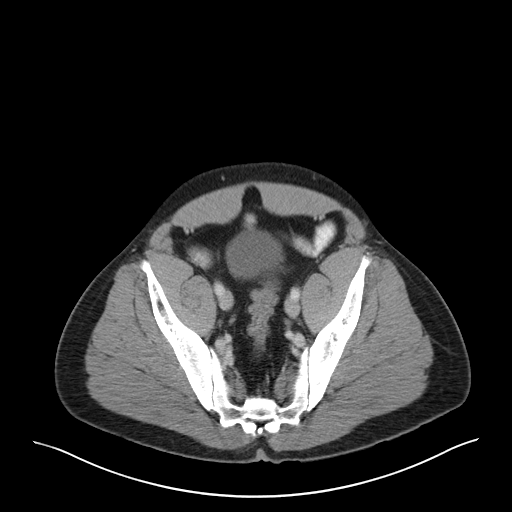
[im 31/89  soft-tissue]
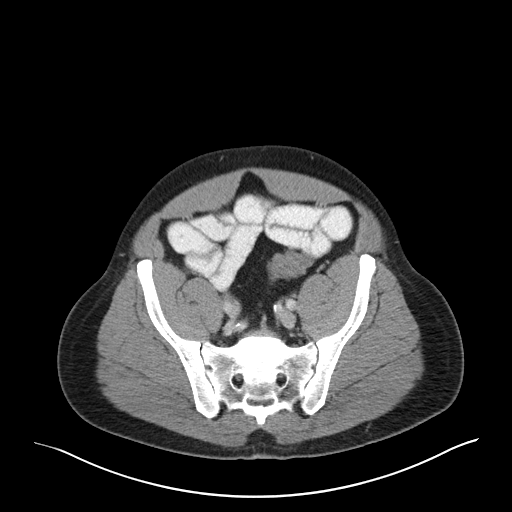
[im 40/89  soft-tissue]
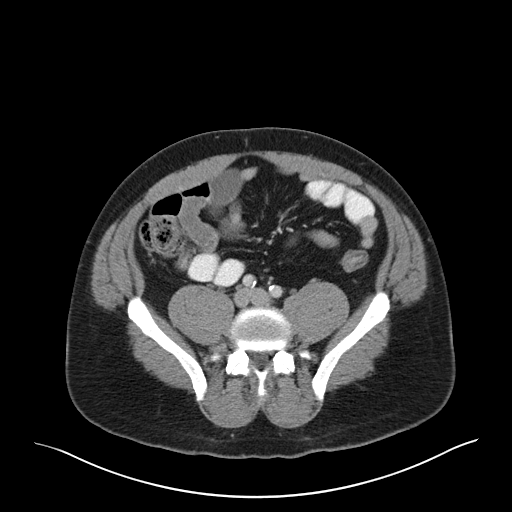
[im 45/89  soft-tissue]
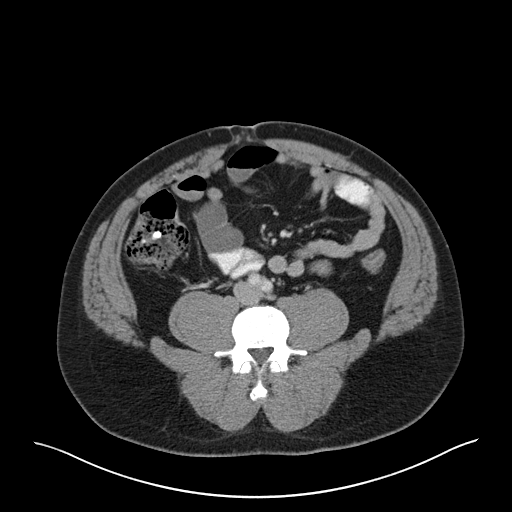
[im 49/89  soft-tissue]
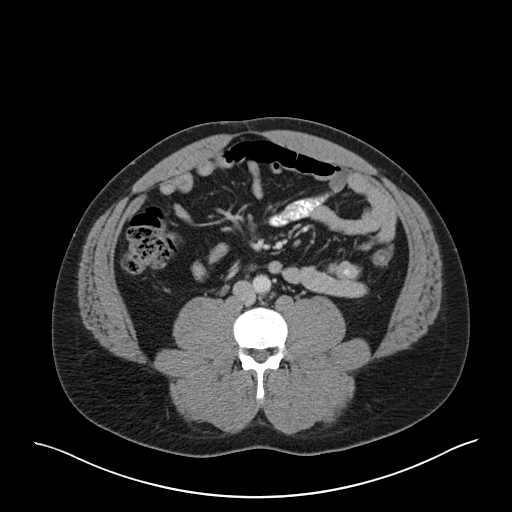
[im 58/89  soft-tissue]
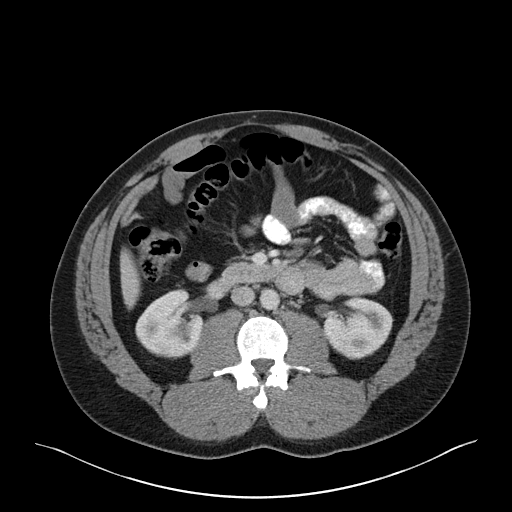
[im 58/89  bone]
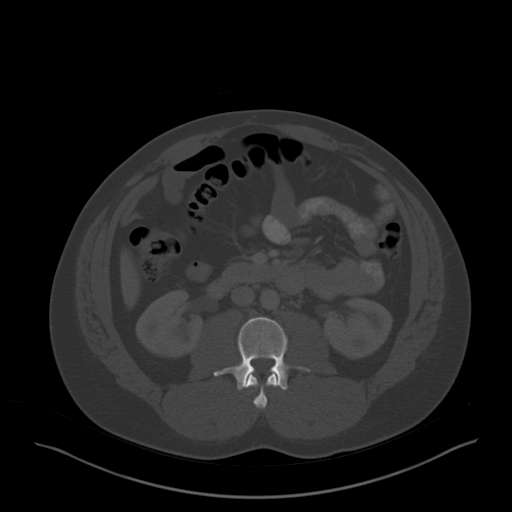
[im 62/89  soft-tissue]
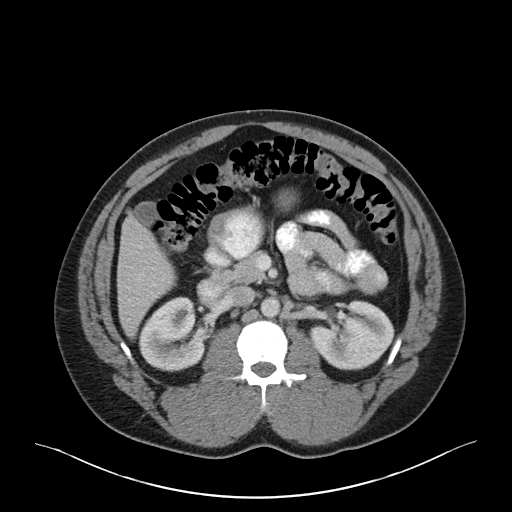
[im 71/89  soft-tissue]
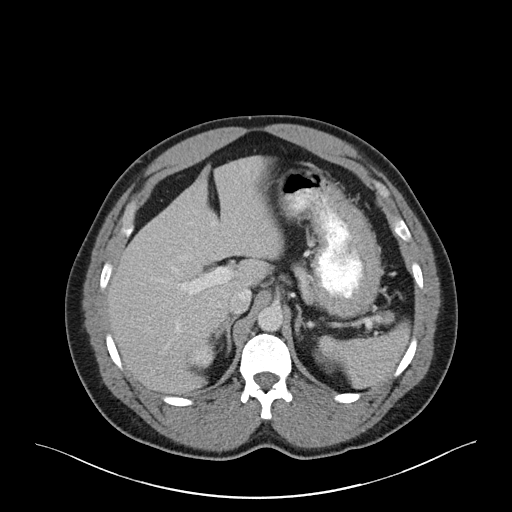
[im 75/89  soft-tissue]
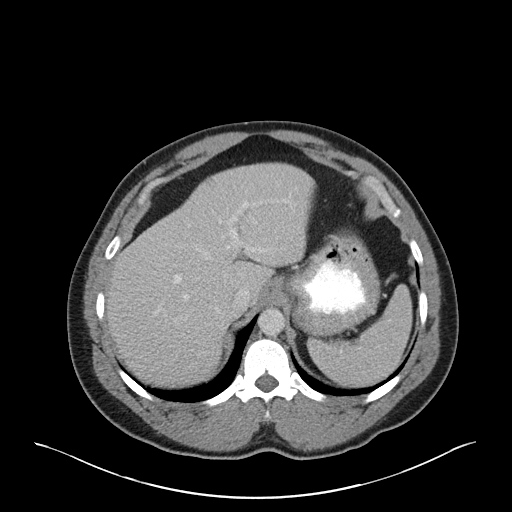
[im 84/89  soft-tissue]
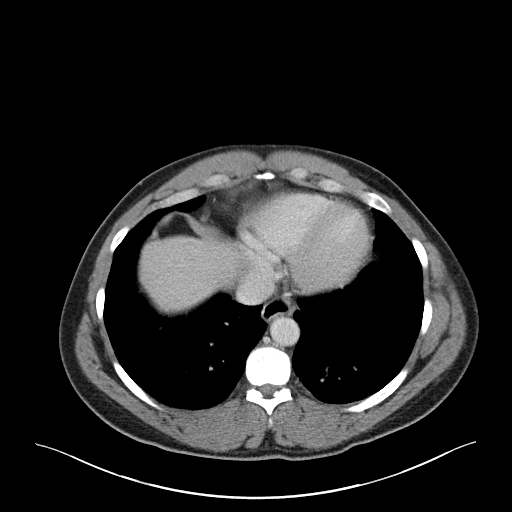

[Series 5: coronals · coronal · 0.75mm/px · 3 of 140 slices shown]
[im 47/140  soft-tissue]
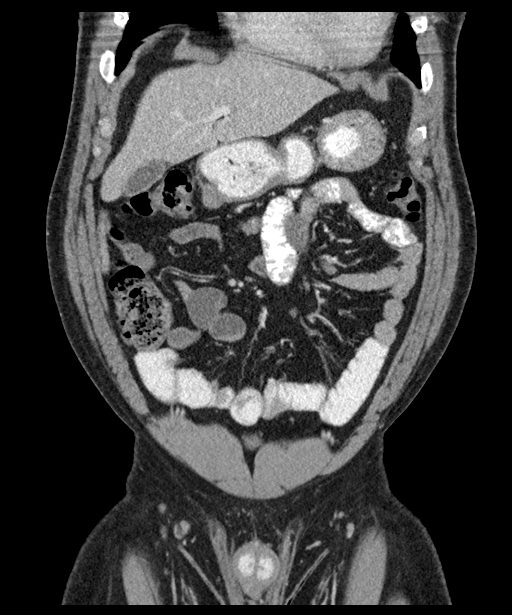
[im 62/140  soft-tissue]
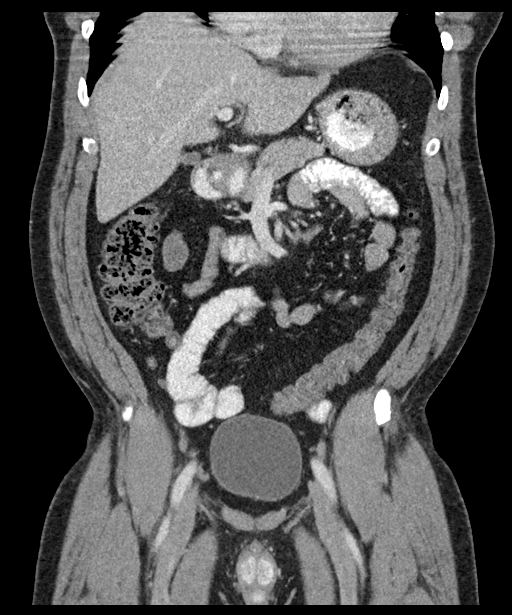
[im 78/140  soft-tissue]
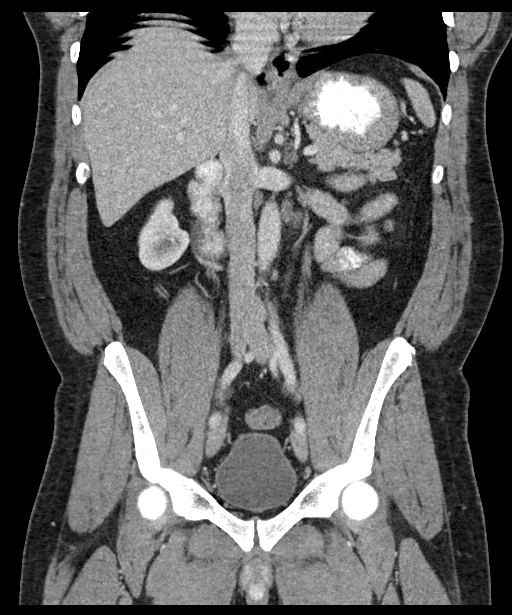

[16 of 46 positions shown; findings below may reference images not displayed]

FINDINGS: Lung bases are clear.  Negative for free intraperitoneal air.

Normal appearance of the liver, gallbladder and portal venous
system. Normal appearance of the pancreas, spleen and adrenal
glands. There is a small low-density structure in both kidneys.
These are too small to definitively characterize but likely
represent cysts. The largest roughly measures 0.9 cm in the left
kidney mid pole region. Negative for hydronephrosis. Normal
appearance of the urinary bladder. Asymmetric wall thickening in the
stomach anterior body region. This finding is nonspecific and no
evidence for perigastric inflammation. Mild atherosclerotic disease
in the common iliac arteries bilaterally. There is no significant
free fluid or lymphadenopathy. No gross abnormality to the prostate
or urinary bladder.

Small calcification in the appendix. No evidence for acute appendix
inflammation. No gross abnormality to the small bowel or large
bowel. There is a small periumbilical hernia containing fat.

Small focus of sclerosis in the right ilium is nonspecific and could
represent a small bone island. No acute bone abnormalities.
IMPRESSION: Asymmetric wall thickening in the stomach body region. Findings are
nonspecific. Gastric inflammation and gastritis cannot be excluded
on CT imaging. Recommend clinical correlation. If this is the area
of clinical concern, consider further evaluation with endoscopy.

Small periumbilical hernia containing fat.

Possible small renal cysts as described.

## 2016-04-22 ENCOUNTER — Other Ambulatory Visit: Payer: Self-pay | Admitting: Family Medicine

## 2016-04-22 DIAGNOSIS — I10 Essential (primary) hypertension: Secondary | ICD-10-CM

## 2016-04-22 MED FILL — ?LANSOPRAZOLE DR 30MG CAPSU: 30 | 30 days supply | Qty: 60 | Fill #3

## 2016-04-22 MED FILL — raNITIdine HCL 150 MG TABS: 150 | 30 days supply | Qty: 30 | Fill #1

## 2016-04-25 ENCOUNTER — Other Ambulatory Visit: Payer: Self-pay | Admitting: Family Medicine

## 2016-04-25 DIAGNOSIS — I10 Essential (primary) hypertension: Secondary | ICD-10-CM

## 2016-04-25 MED FILL — HYDROCORT-PRAMOXINE 2.5-1%: 2.5-1 | 30 days supply | Qty: 60 | Fill #0

## 2016-04-25 NOTE — Telephone Encounter (Signed)
Patient walked in to clinic Requesting refill of losartan Last visit over one year ago Losartan refilled He will need to schedule f/u appt

## 2016-05-16 MED FILL — raNITIdine HCL 150 MG TABS: 150 | 30 days supply | Qty: 30 | Fill #2

## 2016-05-16 MED FILL — LOSARTAN POTASSIUM 25 MG TA: 25 | 30 days supply | Qty: 30 | Fill #0

## 2016-05-16 MED FILL — HYDROCORT-PRAMOXINE 2.5-1%: 2.5-1 | 30 days supply | Qty: 60 | Fill #1

## 2016-05-16 MED FILL — LANSOPRAZOLE 30 MG CPDR: 30 | 30 days supply | Qty: 60 | Fill #4

## 2016-05-30 MED FILL — HYDROCORT-PRAMOXINE 2.5-1%: 2.5-1 | 30 days supply | Qty: 60 | Fill #2

## 2016-06-16 ENCOUNTER — Other Ambulatory Visit: Payer: Self-pay | Admitting: Internal Medicine

## 2016-06-16 ENCOUNTER — Other Ambulatory Visit: Payer: Self-pay | Admitting: Family Medicine

## 2016-06-16 DIAGNOSIS — K649 Unspecified hemorrhoids: Secondary | ICD-10-CM

## 2016-06-16 DIAGNOSIS — I10 Essential (primary) hypertension: Secondary | ICD-10-CM

## 2016-06-16 MED FILL — raNITIdine HCL 150 MG TABS: 150 | 30 days supply | Qty: 30 | Fill #3

## 2016-06-16 MED FILL — HYDROCORT-PRAMOXINE 2.5-1%: 2.5-1 | 30 days supply | Qty: 60 | Fill #0

## 2016-06-16 MED FILL — ?LANSOPRAZOLE DR 30MG CAPSU: 30 | 30 days supply | Qty: 60 | Fill #5

## 2016-06-19 ENCOUNTER — Other Ambulatory Visit: Payer: Self-pay | Admitting: Family Medicine

## 2016-06-19 DIAGNOSIS — I10 Essential (primary) hypertension: Secondary | ICD-10-CM

## 2016-07-08 ENCOUNTER — Other Ambulatory Visit: Payer: Self-pay | Admitting: Family Medicine

## 2016-07-08 ENCOUNTER — Telehealth: Payer: Self-pay | Admitting: Family Medicine

## 2016-07-08 DIAGNOSIS — I10 Essential (primary) hypertension: Secondary | ICD-10-CM

## 2016-07-08 MED ORDER — LOSARTAN POTASSIUM 25 MG PO TABS: 25.0000 mg | ORAL_TABLET | Freq: Every day | ORAL | 0 refills | Status: DC

## 2016-07-08 MED FILL — HYDROCORT-PRAMOXINE 2.5-1%: 2.5-1 | 30 days supply | Qty: 60 | Fill #1

## 2016-07-08 MED FILL — LOSARTAN POTASSIUM 25 MG TA: 25 | 30 days supply | Qty: 30 | Fill #0

## 2016-07-08 NOTE — Telephone Encounter (Signed)
Patient called the office to request one month supply for losartan (COZAAR) 25 MG tablet. Please send it to our pharmacy.   Thank you.

## 2016-07-28 ENCOUNTER — Other Ambulatory Visit: Payer: Self-pay | Admitting: Family Medicine

## 2016-07-28 DIAGNOSIS — I10 Essential (primary) hypertension: Secondary | ICD-10-CM

## 2016-08-08 MED FILL — ?RANITIDINE 150 MG TABLET: 150 MG | 30 days supply | Qty: 30 | Fill #4

## 2016-08-08 MED FILL — HYDROCORT-PRAMOXINE 2.5-1%: 2.5-1 | 30 days supply | Qty: 60 | Fill #2

## 2016-08-08 MED FILL — LOSARTAN POTASSIUM 25 MG TA: 25 | 30 days supply | Qty: 30 | Fill #0

## 2016-08-08 MED FILL — ?LANSOPRAZOLE DR 30MG CAPSU: 30 | 30 days supply | Qty: 60 | Fill #6

## 2016-08-27 ENCOUNTER — Other Ambulatory Visit: Payer: Self-pay | Admitting: Internal Medicine

## 2016-08-27 ENCOUNTER — Other Ambulatory Visit: Payer: Self-pay | Admitting: Family Medicine

## 2016-08-27 DIAGNOSIS — I10 Essential (primary) hypertension: Secondary | ICD-10-CM

## 2016-08-27 DIAGNOSIS — K649 Unspecified hemorrhoids: Secondary | ICD-10-CM

## 2016-08-27 MED FILL — ?LANSOPRAZOLE DR 30MG CAPSU: 30 | 30 days supply | Qty: 60 | Fill #7

## 2016-09-03 ENCOUNTER — Other Ambulatory Visit: Payer: Self-pay | Admitting: Family Medicine

## 2016-09-03 DIAGNOSIS — I10 Essential (primary) hypertension: Secondary | ICD-10-CM

## 2016-09-03 MED FILL — HYDROCORT-PRAMOXINE 2.5-1%: 2.5-1 | 30 days supply | Qty: 60 | Fill #0

## 2016-09-03 MED FILL — raNITIdine HCL 150 MG TABS: 150 | 30 days supply | Qty: 30 | Fill #5

## 2016-09-29 ENCOUNTER — Other Ambulatory Visit: Payer: Self-pay | Admitting: Family Medicine

## 2016-09-29 DIAGNOSIS — I10 Essential (primary) hypertension: Secondary | ICD-10-CM

## 2016-09-29 MED FILL — HYDROCORT-PRAMOXINE 2.5-1%: 2.5-1 | 30 days supply | Qty: 60 | Fill #1

## 2016-09-29 MED FILL — raNITIdine HCL 150 MG TABS: 150 | 30 days supply | Qty: 30 | Fill #6

## 2016-09-29 MED FILL — ?LANSOPRAZOLE DR 30MG CAPSU: 30 | 30 days supply | Qty: 60 | Fill #8

## 2016-10-03 ENCOUNTER — Other Ambulatory Visit: Payer: Self-pay | Admitting: Family Medicine

## 2016-10-03 DIAGNOSIS — I10 Essential (primary) hypertension: Secondary | ICD-10-CM

## 2016-10-23 ENCOUNTER — Other Ambulatory Visit: Payer: Self-pay | Admitting: Family Medicine

## 2016-10-23 DIAGNOSIS — I10 Essential (primary) hypertension: Secondary | ICD-10-CM

## 2016-10-23 MED FILL — ?LANSOPRAZOLE DR 30MG CAPSU: 30 | 30 days supply | Qty: 60 | Fill #9

## 2016-10-23 MED FILL — raNITIdine HCL 150 MG TABS: 150 | 30 days supply | Qty: 30 | Fill #7

## 2016-10-23 MED FILL — HYDROCORT-PRAMOXINE 2.5-1%: 2.5-1 | 30 days supply | Qty: 60 | Fill #2

## 2016-11-17 ENCOUNTER — Other Ambulatory Visit: Payer: Self-pay | Admitting: Internal Medicine

## 2016-11-17 DIAGNOSIS — K649 Unspecified hemorrhoids: Secondary | ICD-10-CM

## 2016-11-17 MED FILL — raNITIdine HCL 150 MG TABS: 150 | 30 days supply | Qty: 30 | Fill #8

## 2016-11-17 MED FILL — HYDROCORT-PRAMOXINE 2.5-1%: 2.5-1 | 30 days supply | Qty: 60 | Fill #0

## 2016-11-17 MED FILL — ?LANSOPRAZOLE DR 30MG CAPSU: 30 | 30 days supply | Qty: 60 | Fill #10

## 2016-12-11 ENCOUNTER — Other Ambulatory Visit: Payer: Self-pay | Admitting: Internal Medicine

## 2016-12-11 MED FILL — ?LANSOPRAZOLE DR 30MG CAPSU: 30 | 30 days supply | Qty: 60 | Fill #0

## 2016-12-11 MED FILL — raNITIdine HCL 150 MG TABS: 150 | 30 days supply | Qty: 30 | Fill #9

## 2016-12-11 MED FILL — HYDROCORT-PRAMOXINE 2.5-1%: 2.5-1 | 30 days supply | Qty: 60 | Fill #1

## 2016-12-24 ENCOUNTER — Encounter: Payer: Self-pay | Admitting: Internal Medicine

## 2016-12-24 DIAGNOSIS — D126 Benign neoplasm of colon, unspecified: Secondary | ICD-10-CM | POA: Insufficient documentation

## 2016-12-26 ENCOUNTER — Ambulatory Visit: Payer: Self-pay | Admitting: Internal Medicine

## 2017-01-06 MED FILL — ?LANSOPRAZOLE DR 30MG CAPSU: 30 | 30 days supply | Qty: 60 | Fill #1

## 2017-01-06 MED FILL — raNITIdine HCL 150 MG TABS: 150 | 30 days supply | Qty: 30 | Fill #10

## 2017-01-06 MED FILL — HYDROCORT-PRAMOXINE 2.5-1%: 2.5-1 | 30 days supply | Qty: 60 | Fill #2

## 2017-02-06 ENCOUNTER — Other Ambulatory Visit: Payer: Self-pay | Admitting: Internal Medicine

## 2017-02-06 DIAGNOSIS — K649 Unspecified hemorrhoids: Secondary | ICD-10-CM

## 2017-02-06 MED FILL — HYDROCORT-PRAMOXINE 2.5-1%: 2.5-1 | 30 days supply | Qty: 60 | Fill #0

## 2017-02-06 MED FILL — ?LANSOPRAZOLE DR 30MG CAPSU: 30 | 30 days supply | Qty: 60 | Fill #2

## 2017-02-06 MED FILL — raNITIdine HCL 150 MG TABS: 150 | 30 days supply | Qty: 30 | Fill #11

## 2017-03-03 ENCOUNTER — Other Ambulatory Visit: Payer: Self-pay | Admitting: Internal Medicine

## 2017-03-03 MED FILL — ?LANSOPRAZOLE DR 30MG CAPSU: 30 | 30 days supply | Qty: 60 | Fill #3

## 2017-03-03 MED FILL — raNITIdine HCL 150 MG TABS: 150 | 30 days supply | Qty: 30 | Fill #0

## 2017-03-03 MED FILL — HYDROCORT-PRAMOXINE 2.5-1%: 2.5-1 | 30 days supply | Qty: 60 | Fill #1

## 2017-04-03 MED FILL — HYDROCORT-PRAMOXINE 2.5-1%: 2.5-1 | 30 days supply | Qty: 60 | Fill #2

## 2017-04-03 MED FILL — ?LANSOPRAZOLE DR 30MG CAPSU: 30 | 30 days supply | Qty: 60 | Fill #4

## 2017-04-03 MED FILL — raNITIdine HCL 150 MG TABS: 150 | 30 days supply | Qty: 30 | Fill #1

## 2017-04-08 ENCOUNTER — Ambulatory Visit: Payer: Self-pay | Admitting: Nurse Practitioner

## 2017-05-08 ENCOUNTER — Other Ambulatory Visit: Payer: Self-pay | Admitting: Internal Medicine

## 2017-05-08 DIAGNOSIS — K649 Unspecified hemorrhoids: Secondary | ICD-10-CM

## 2017-05-08 MED FILL — ?LANSOPRAZOLE DR 30MG CAPSU: 30 | 30 days supply | Qty: 60 | Fill #5

## 2017-05-08 MED FILL — HYDROCORT-PRAMOXINE 2.5%-1%: 2.5-1 | 30 days supply | Qty: 60 | Fill #0

## 2017-05-08 MED FILL — raNITIdine HCL 150 MG TABS: 150 | 30 days supply | Qty: 30 | Fill #2

## 2017-06-29 MED FILL — HYDROCORT-PRAMOXINE 2.5-1%: 2.5-1 | 30 days supply | Qty: 60 | Fill #1

## 2017-06-29 MED FILL — LANSOPRAZOLE DR 30 MG CAPSU: 30 | 30 days supply | Qty: 60 | Fill #6

## 2017-06-29 MED FILL — raNITIdine HCL 150 MG TABS: 150 | 30 days supply | Qty: 30 | Fill #3

## 2017-07-25 ENCOUNTER — Encounter: Payer: Self-pay | Admitting: Internal Medicine

## 2017-09-09 MED FILL — raNITIdine HCL 150 MG TABS: 150 | 30 days supply | Qty: 30 | Fill #4

## 2017-09-09 MED FILL — LANSOPRAZOLE DR 30 MG CAPSU: 30 | 30 days supply | Qty: 60 | Fill #7

## 2017-09-09 MED FILL — HYDROCORT-PRAMOXINE 2.5-1%: 2.5-1 | 30 days supply | Qty: 60 | Fill #2

## 2017-10-02 ENCOUNTER — Other Ambulatory Visit: Payer: Self-pay | Admitting: Internal Medicine

## 2017-10-02 DIAGNOSIS — K649 Unspecified hemorrhoids: Secondary | ICD-10-CM

## 2017-10-02 MED FILL — ?LANSOPRAZOLE DR 30MG CAPSU: 30 | 30 days supply | Qty: 60 | Fill #8

## 2017-10-02 MED FILL — raNITIdine HCL 150 MG TABS: 150 | 30 days supply | Qty: 30 | Fill #5

## 2017-10-05 ENCOUNTER — Other Ambulatory Visit: Payer: Self-pay | Admitting: Internal Medicine

## 2017-10-05 DIAGNOSIS — K649 Unspecified hemorrhoids: Secondary | ICD-10-CM

## 2017-10-06 ENCOUNTER — Telehealth: Payer: Self-pay | Admitting: Internal Medicine

## 2017-10-06 NOTE — Telephone Encounter (Signed)
Since pharmacy states they did not get rx, I have given verbal.

## 2017-11-12 MED FILL — HYDROCORT-PRAMOXINE 2.5-1%: 2.5-1 | 15 days supply | Qty: 30 | Fill #0

## 2018-01-28 MED FILL — HYDROCORTISONE ACE-PRAMOXIN: 2.5-1 | 15 days supply | Qty: 30 | Fill #0

## 2018-03-29 MED FILL — HYDROCORTISONE ACE-PRAMOXIN: 2.5-1 | 15 days supply | Qty: 30 | Fill #1

## 2018-05-03 ENCOUNTER — Other Ambulatory Visit: Payer: Self-pay | Admitting: Internal Medicine

## 2018-05-03 DIAGNOSIS — K649 Unspecified hemorrhoids: Secondary | ICD-10-CM

## 2018-05-21 ENCOUNTER — Other Ambulatory Visit: Payer: Self-pay | Admitting: Internal Medicine

## 2018-05-21 DIAGNOSIS — K649 Unspecified hemorrhoids: Secondary | ICD-10-CM

## 2018-06-02 ENCOUNTER — Telehealth: Payer: Self-pay | Admitting: Internal Medicine

## 2018-06-02 DIAGNOSIS — K649 Unspecified hemorrhoids: Secondary | ICD-10-CM

## 2018-06-03 MED ORDER — HYDROCORTISONE ACE-PRAMOXINE 2.5-1 % RE CREA: TOPICAL_CREAM | RECTAL | 0 refills | Status: DC

## 2018-06-03 NOTE — Telephone Encounter (Signed)
Rx sent 

## 2018-06-04 MED FILL — HYDROCORTISONE ACE-PRAMOXIN: 2.5-1 | 14 days supply | Qty: 30 | Fill #0

## 2018-07-07 MED FILL — HYDROCORTISONE ACE-PRAMOXIN: 2.5-1 | 14 days supply | Qty: 30 | Fill #1

## 2018-07-14 ENCOUNTER — Ambulatory Visit: Payer: Self-pay | Admitting: Internal Medicine

## 2018-08-03 ENCOUNTER — Telehealth: Payer: Self-pay | Admitting: Internal Medicine

## 2018-08-03 ENCOUNTER — Other Ambulatory Visit: Payer: Self-pay | Admitting: Internal Medicine

## 2018-08-03 DIAGNOSIS — K649 Unspecified hemorrhoids: Secondary | ICD-10-CM

## 2018-08-03 NOTE — Telephone Encounter (Signed)
Pt is requesting rf for [facility]ortis[facility] Pt tried to make appt with Dr. Hilarie Fredrickson but he is fully booked until May, calendar for May not available yet.

## 2018-08-04 MED ORDER — HYDROCORTISONE ACE-PRAMOXINE 2.5-1 % RE CREA: TOPICAL_CREAM | RECTAL | 0 refills | Status: AC

## 2018-08-04 MED FILL — HYDROCORT-PRAMOXINE 2.5%-1%: 2.5-1 | 15 days supply | Qty: 30 | Fill #0

## 2018-08-04 NOTE — Telephone Encounter (Signed)
Went ahead and sent 1 month rx as to limit unnecessary exposure to medical setting in lite of current COVID-19 outbreak; however, patient will need office visit for continued refills as we have not seen him in over 2 years.

## 2023-03-19 ENCOUNTER — Encounter: Payer: Self-pay | Admitting: Internal Medicine

## 2023-05-06 ENCOUNTER — Other Ambulatory Visit: Payer: Self-pay

## 2023-05-06 ENCOUNTER — Ambulatory Visit (AMBULATORY_SURGERY_CENTER): Payer: Self-pay | Admitting: *Deleted

## 2023-05-06 ENCOUNTER — Telehealth: Payer: Self-pay | Admitting: *Deleted

## 2023-05-06 VITALS — Ht 68.5 in | Wt 200.0 lb

## 2023-05-06 DIAGNOSIS — Z860100 Personal history of colon polyps, unspecified: Secondary | ICD-10-CM

## 2023-05-06 DIAGNOSIS — Z800 Family history of malignant neoplasm of digestive organs: Secondary | ICD-10-CM

## 2023-05-06 MED ORDER — NA SULFATE-K SULFATE-MG SULF 17.5-3.13-1.6 GM/177ML PO SOLN
1.0000 | Freq: Once | ORAL | 0 refills | Status: AC
  Filled 2023-05-06 – 2023-05-21 (×2): qty 354, 1d supply, fill #0

## 2023-05-06 NOTE — Progress Notes (Signed)
Pt's name and DOB verified at the beginning of the pre-visit wit 2 identifiers  Pt denies any difficulty with ambulating,sitting, laying down or rolling side to side  Pt has no issues with ambulation   Pt has no issues moving head neck or swallowing  No egg or soy allergy known to patient   No issues known to pt with past sedation with any surgeries or procedures  Pt denies having issues being intubated  No FH of Malignant Hyperthermia  Pt is not on diet pills or shots  Pt is not on home 02   Pt is not on blood thinners   Pt denies issues with constipation   Pt is not on dialysis  Pt denise any abnormal heart rhythms   Pt denies any upcoming cardiac testing  Pt encouraged to use to use Singlecare or Goodrx to reduce cost   Patient's chart reviewed by Cathlyn Parsons CNRA prior to pre-visit and patient appropriate for the [facility].  Pre-visit completed and red dot placed by patient's name on their procedure day (on provider's schedule).  .  Visit by phone  Pt states weight is    Instructed pt why it is important to and  to call if they have any changes in health or new medications. Directed them to the # given and on instructions.     Instructions reviewed. Pt given both [facility] main # and MD on call # prior to instructions.  Pt states understanding. Instructed to review again prior to procedure. Pt states they will.   Instructions sent by mail with coupon and by My Chart   Coupon sent via text to mobile phone and pt verified they received it

## 2023-05-06 NOTE — Telephone Encounter (Signed)
 Attempt to reach pt for pre-visit. LM with call back #.  Will attempt to reach again in 5 min due to no other # listed in profile  2nd attempt reached pt

## 2023-05-15 ENCOUNTER — Other Ambulatory Visit: Payer: Self-pay

## 2023-05-19 ENCOUNTER — Telehealth: Payer: Self-pay | Admitting: Internal Medicine

## 2023-05-19 NOTE — Telephone Encounter (Signed)
 Patient wondering if EGD necessary He does have a history of GERD with esophagitis from 2016; would not need EGD unless he is having active symptoms to warrant such an exam  I do not have the ability to add the upper endoscopy onto his schedule colonoscopy day in January due to lack of open slots  I would recommend that he keep colonoscopy as scheduled (this procedure is overdue); I can discuss upper symptoms with him while he is here and if indicated we could schedule EGD for a later date

## 2023-05-19 NOTE — Telephone Encounter (Signed)
Spoke with pt and he is aware of recommendations per Dr. Hilarie Fredrickson.

## 2023-05-19 NOTE — Telephone Encounter (Signed)
 Good Morning, Dr. Albertus,   I had a patient called and stated that he was wondering on weather it would be a good idea for him to have a EGD and Colonoscopy done at the same time. Patient had a EGD procedure back in 2016. What would your recommendation be on scheduling?  Thank you.

## 2023-05-21 ENCOUNTER — Other Ambulatory Visit (HOSPITAL_COMMUNITY): Payer: Self-pay

## 2023-05-21 ENCOUNTER — Other Ambulatory Visit: Payer: Self-pay

## 2023-05-25 ENCOUNTER — Encounter: Payer: Self-pay | Admitting: Certified Registered Nurse Anesthetist

## 2023-05-27 ENCOUNTER — Other Ambulatory Visit: Payer: Self-pay

## 2023-05-27 ENCOUNTER — Ambulatory Visit: Payer: Self-pay | Admitting: Internal Medicine

## 2023-05-27 ENCOUNTER — Encounter: Payer: Self-pay | Admitting: Internal Medicine

## 2023-05-27 VITALS — BP 120/77 | HR 87 | Temp 98.5°F | Resp 29 | Ht 68.0 in | Wt 200.0 lb

## 2023-05-27 DIAGNOSIS — D124 Benign neoplasm of descending colon: Secondary | ICD-10-CM

## 2023-05-27 DIAGNOSIS — D125 Benign neoplasm of sigmoid colon: Secondary | ICD-10-CM

## 2023-05-27 DIAGNOSIS — K635 Polyp of colon: Secondary | ICD-10-CM

## 2023-05-27 DIAGNOSIS — K5730 Diverticulosis of large intestine without perforation or abscess without bleeding: Secondary | ICD-10-CM

## 2023-05-27 DIAGNOSIS — Z1211 Encounter for screening for malignant neoplasm of colon: Secondary | ICD-10-CM

## 2023-05-27 DIAGNOSIS — K219 Gastro-esophageal reflux disease without esophagitis: Secondary | ICD-10-CM

## 2023-05-27 DIAGNOSIS — Z860101 Personal history of adenomatous and serrated colon polyps: Secondary | ICD-10-CM

## 2023-05-27 DIAGNOSIS — K648 Other hemorrhoids: Secondary | ICD-10-CM

## 2023-05-27 DIAGNOSIS — K449 Diaphragmatic hernia without obstruction or gangrene: Secondary | ICD-10-CM

## 2023-05-27 DIAGNOSIS — Z800 Family history of malignant neoplasm of digestive organs: Secondary | ICD-10-CM

## 2023-05-27 DIAGNOSIS — K2100 Gastro-esophageal reflux disease with esophagitis, without bleeding: Secondary | ICD-10-CM

## 2023-05-27 DIAGNOSIS — Z860100 Personal history of colon polyps, unspecified: Secondary | ICD-10-CM

## 2023-05-27 MED ORDER — PANTOPRAZOLE SODIUM 40 MG PO TBEC
40.0000 mg | DELAYED_RELEASE_TABLET | Freq: Two times a day (BID) | ORAL | 3 refills | Status: AC
  Filled 2023-05-27: qty 180, 90d supply, fill #0

## 2023-05-27 MED ORDER — SODIUM CHLORIDE 0.9 % IV SOLN: 500.0000 mL | INTRAVENOUS | Status: DC

## 2023-05-27 NOTE — Patient Instructions (Signed)
  Educational handout provided to patient related to Hemorrhoids, Polyps, and Diverticulosis  Resume previous diet  Continue present medications  Awaiting pathology resultsYOU HAD AN ENDOSCOPIC PROCEDURE TODAY AT [facility]:   Refer to the procedure report that was given to you for any specific questions about what was found during the examination.  If the procedure report does not answer your questions, please call your gastroenterologist to clarify.  If you requested that your care partner not be given the details of your procedure findings, then the procedure report has been included in a sealed envelope for you to review at your convenience later.  YOU SHOULD EXPECT: Some feelings of bloating in the abdomen. Passage of more gas than usual.  Walking can help get rid of the air that was put into your GI tract during the procedure and reduce the bloating. If you had a lower endoscopy (such as a colonoscopy or flexible sigmoidoscopy) you may notice spotting of blood in your stool or on the toilet paper. If you underwent a bowel prep for your procedure, you may not have a normal bowel movement for a few days.  Please Note:  You might notice some irritation and congestion in your nose or some drainage.  This is from the oxygen used during your procedure.  There is no need for concern and it should clear up in a day or so.  SYMPTOMS TO REPORT IMMEDIATELY:  Following lower endoscopy (colonoscopy or flexible sigmoidoscopy):  Excessive amounts of blood in the stool  Significant tenderness or worsening of abdominal pains  Swelling of the abdomen that is new, acute  Fever of 100F or higher  Following upper endoscopy (EGD)  Vomiting of blood or coffee ground material  New chest pain or pain under the shoulder blades  Painful or persistently difficult swallowing  New shortness of breath  Fever of 100F or higher  Black, tarry-looking stools  For urgent or emergent issues, a  gastroenterologist can be reached at any hour by calling [phone]. Do not use MyChart messaging for urgent concerns.    DIET:  We do recommend a small meal at first, but then you may proceed to your regular diet.  Drink plenty of fluids but you should avoid alcoholic beverages for 24 hours.  ACTIVITY:  You should plan to take it easy for the rest of today and you should NOT DRIVE or use heavy machinery until tomorrow (because of the sedation medicines used during the test).    FOLLOW UP: Our staff will call the number listed on your records the next business day following your procedure.  We will call around 7:15- 8:00 am to check on you and address any questions or concerns that you may have regarding the information given to you following your procedure. If we do not reach you, we will leave a message.     If any biopsies were taken you will be contacted by phone or by letter within the next 1-3 weeks.  Please call us at [phone] if you have not heard about the biopsies in 3 weeks.    SIGNATURES/CONFIDENTIALITY: You and/or your care partner have signed paperwork which will be entered into your electronic medical record.  These signatures attest to the fact that that the information above on your After Visit Summary has been reviewed and is understood.  Full responsibility of the confidentiality of this discharge information lies with you and/or your care-partner.

## 2023-05-27 NOTE — Progress Notes (Signed)
1521 Robinul 0.1 mg IV given due large amount of secretions upon assessment.  MD made aware, vss 

## 2023-05-27 NOTE — Op Note (Signed)
 [facility] Patient Name: Rodney Knox Procedure Date: 05/27/2023 3:05 PM MRN: [id number] Endoscopist: Gordy CHRISTELLA Starch , MD, [id number] Age: 53 Referring MD:  Date of Birth: 07/11/1970 Gender: Male Account #: [id number] Procedure:                Upper GI endoscopy Indications:              Heartburn, Gastro-esophageal reflux disease --                            breakthrough heartburn despite lansoprazole  30 mg                            twice daily Medicines:                Monitored Anesthesia Care Procedure:                Pre-Anesthesia Assessment:                           - Prior to the procedure, a History and Physical                            was performed, and patient medications and                            allergies were reviewed. The patient's tolerance of                            previous anesthesia was also reviewed. The risks                            and benefits of the procedure and the sedation                            options and risks were discussed with the patient.                            All questions were answered, and informed consent                            was obtained. Prior Anticoagulants: The patient has                            taken no anticoagulant or antiplatelet agents. ASA                            Grade Assessment: II - A patient with mild systemic                            disease. After reviewing the risks and benefits,                            the patient was deemed in satisfactory condition to  undergo the procedure.                           After obtaining informed consent, the endoscope was                            passed under direct vision. Throughout the                            procedure, the patient's blood pressure, pulse, and                            oxygen saturations were monitored continuously. The                            Olympus Scope [id number] was introduced  through the                            mouth, and advanced to the second part of duodenum.                            The upper GI endoscopy was accomplished without                            difficulty. The patient tolerated the procedure                            well. Scope In: Scope Out: Findings:                 LA Grade A (one or more mucosal breaks less than 5                            mm, not extending between tops of 2 mucosal folds)                            esophagitis with no bleeding was found at the                            gastroesophageal junction.                           A 2 cm hiatal hernia was present.                           The gastroesophageal flap valve was visualized                            endoscopically and classified as Hill Grade II                            (fold present, opens with respiration).                           The entire examined stomach was normal.  The examined duodenum was normal. Complications:            No immediate complications. Estimated Blood Loss:     Estimated blood loss: none. Impression:               - LA Grade A reflux esophagitis with no bleeding.                           - 2 cm hiatal hernia. This likely exacerbates                            reflux despite PPI.                           - Normal stomach.                           - Normal examined duodenum.                           - No specimens collected. Recommendation:           - Patient has a contact number available for                            emergencies. The signs and symptoms of potential                            delayed complications were discussed with the                            patient. Return to normal activities tomorrow.                            Written discharge instructions were provided to the                            patient.                           - Resume previous diet.                           -  Continue present medications.                           - If covered/affordable could try pantoprazole  40                            mg twice daily in place of lansoprazole . This is                            best taken 30 min before your 1st and last meal of                            the day.                           -  If persistent heartburn/reflux symptoms despite                            medication change please call my office for                            additional recommendations. Gordy CHRISTELLA Starch, MD 05/27/2023 3:44:07 PM This report has been signed electronically.

## 2023-05-27 NOTE — Progress Notes (Signed)
 1528HR > 100 with esmolol 25 mg given IV, MD updated, vss

## 2023-05-27 NOTE — Op Note (Signed)
 [facility] Patient Name: Rodney Knox Procedure Date: 05/27/2023 3:04 PM MRN: [id number] Endoscopist: Gordy CHRISTELLA Starch , MD, [id number] Age: 53 Referring MD:  Date of Birth: Feb 18, 1971 Gender: Male Account #: [id number] Procedure:                Colonoscopy Indications:              High risk colon cancer surveillance: Personal                            history of adenoma (10 mm or greater in size),                            Family history of colon cancer in a first-degree                            relative (father), index and last colonoscopy Feb                            2016 Medicines:                Monitored Anesthesia Care Procedure:                Pre-Anesthesia Assessment:                           - Prior to the procedure, a History and Physical                            was performed, and patient medications and                            allergies were reviewed. The patient's tolerance of                            previous anesthesia was also reviewed. The risks                            and benefits of the procedure and the sedation                            options and risks were discussed with the patient.                            All questions were answered, and informed consent                            was obtained. Prior Anticoagulants: The patient has                            taken no anticoagulant or antiplatelet agents. ASA                            Grade Assessment: II - A patient with mild systemic  disease. After reviewing the risks and benefits,                            the patient was deemed in satisfactory condition to                            undergo the procedure.                           After obtaining informed consent, the colonoscope                            was passed under direct vision. Throughout the                            procedure, the patient's blood pressure, pulse, and                             oxygen saturations were monitored continuously. The                            CF [id number] #[id number] was introduced through the anus                            and advanced to the terminal ileum. The colonoscopy                            was performed without difficulty. The patient                            tolerated the procedure well. The quality of the                            bowel preparation was good. The terminal ileum,                            ileocecal valve, appendiceal orifice, and rectum                            were photographed. Scope In: 3:30:34 PM Scope Out: 3:39:10 PM Scope Withdrawal Time: 0 hours 7 minutes 40 seconds  Total Procedure Duration: 0 hours 8 minutes 36 seconds  Findings:                 The digital rectal exam was normal.                           The terminal ileum appeared normal.                           Two sessile and semi-pedunculated polyps were found                            in the descending colon. The polyps were 5 to 7 mm  in size. These polyps were removed with a cold                            snare. Resection and retrieval were complete.                           A 6 mm polyp was found in the sigmoid colon. The                            polyp was semi-pedunculated. The polyp was removed                            with a cold snare. Resection and retrieval were                            complete.                           A few small-mouthed diverticula were found in the                            sigmoid colon, descending colon and ascending colon.                           Internal hemorrhoids were found during                            retroflexion. The hemorrhoids were medium-sized. Complications:            No immediate complications. Estimated Blood Loss:     Estimated blood loss was minimal. Impression:               - The examined portion of the ileum was normal.                            - Two 5 to 7 mm polyps in the descending colon,                            removed with a cold snare. Resected and retrieved.                           - One 6 mm polyp in the sigmoid colon, removed with                            a cold snare. Resected and retrieved.                           - Mild diverticulosis in the sigmoid colon, in the                            descending colon and in the ascending colon.                           - Internal hemorrhoids. Hemorrhoidal  banding scars                            seen in distal rectum. Recommendation:           - Patient has a contact number available for                            emergencies. The signs and symptoms of potential                            delayed complications were discussed with the                            patient. Return to normal activities tomorrow.                            Written discharge instructions were provided to the                            patient.                           - Resume previous diet.                           - Continue present medications.                           - Await pathology results.                           - Repeat colonoscopy is recommended for                            surveillance. The colonoscopy date will be                            determined after pathology results from today's                            exam become available for review (but no longer                            than 5 years). Gordy CHRISTELLA Starch, MD 05/27/2023 3:48:07 PM This report has been signed electronically.

## 2023-05-27 NOTE — Progress Notes (Signed)
 GASTROENTEROLOGY PROCEDURE H&P NOTE   Primary Care Physician: Patient, No Pcp Per    Reason for Procedure:   Hx of GERD with esophagitis, hx of colon adenoma > 1 cm, FH colon cancer in pt's father   Plan:    EGD/colon  Patient is appropriate for endoscopic procedure(s) in the ambulatory ([facility]) setting.  The nature of the procedure, as well as the risks, benefits, and alternatives were carefully and thoroughly reviewed with the patient. Ample time for discussion and questions allowed. The patient understood, was satisfied, and agreed to proceed.     HPI: Rodney Knox is a 53 y.o. male who presents for EGD/colon.  Medical history as below.  Tolerated the prep.  No recent chest pain or shortness of breath.  No abdominal pain today.  Recent breakthrough heartburn despite lansoprazole  twice daily.  No dysphagia.  Past Medical History:  Diagnosis Date   GERD (gastroesophageal reflux disease)    Hiatal hernia    Hypertension    Internal hemorrhoids    Periumbilical hernia    Renal cyst    Sarcoidosis    Tubular adenoma of colon 06/29/2014    Past Surgical History:  Procedure Laterality Date   CHEST TUBE INSERTION  05/19/2000   COLONOSCOPY      Prior to Admission medications   Medication Sig Start Date End Date Taking? Authorizing Provider  diltiazem  2 % GEL Apply 1 application topically 2 (two) times daily. Patient not taking: Reported on 05/06/2023 04/17/15   Brennin Durfee, Gordy HERO, MD  DiltiaZEM  HCl POWD APPLY RECTALLY TWICE A DAY AS DIRECTED. Patient not taking: Reported on 05/06/2023 06/18/15   Albertus Gordy HERO, MD  ferrous sulfate  325 (65 FE) MG tablet Take 1 tablet (325 mg total) by mouth 3 (three) times daily with meals. Patient not taking: Reported on 05/06/2023 04/24/15   Funches, Josalyn, MD  hydrALAZINE (APRESOLINE) 10 MG tablet Take 10 mg by mouth 3 (three) times daily.    [provider]  hydrocortisone -pramoxine (ANALPRAM -HC) 2.5-1 % rectal cream PLACE 1  APPLICATION RECTALLY 2 TIMES DAILY Patient not taking: Reported on 05/06/2023 08/04/18   Mckay Tegtmeyer, Gordy HERO, MD  ipratropium (ATROVENT ) 0.06 % nasal spray Place 2 sprays into both nostrils 4 (four) times daily. Patient not taking: Reported on 05/06/2023 05/25/14   Remonia Alm PARAS, MD  lansoprazole  (PREVACID ) 30 MG capsule TAKE 1 CAPSULE BY MOUTH 2 TIMES DAILY. 12/11/16   Glynis Hunsucker, Gordy HERO, MD  losartan  (COZAAR ) 25 MG tablet TAKE 1 TABLET BY MOUTH DAILY. 07/28/16   Funches, Josalyn, MD  meloxicam (MOBIC) 15 MG tablet Take 15 mg by mouth daily. 04/29/23   [provider]  oseltamivir  (TAMIFLU ) 75 MG capsule Take 1 capsule (75 mg total) by mouth every 12 (twelve) hours. Patient not taking: Reported on 05/06/2023 08/02/15   Kirichenko, Tatyana, PA-C  predniSONE  (DELTASONE ) 10 MG tablet Take 5 tab day 1, take 4 tab day 2, take 3 tab day 3, take 2 tab day 4, and take 1 tab day 5 08/02/15   Kirichenko, Tatyana, PA-C  ranitidine  (ZANTAC ) 150 MG tablet TAKE 1 TABLET BY MOUTH AT BEDTIME AS NEEDED FOR HEARTBURN. Patient not taking: Reported on 05/06/2023 03/03/17   Alquan Morrish, Gordy HERO, MD  Spacer/Aero-Holding Chambers (AEROCHAMBER MV) inhaler Use as instructed 10/11/14   McQuaid, Douglas B, MD  Vitamin D , Ergocalciferol , (DRISDOL ) 50000 UNITS CAPS capsule Take 1 capsule (50,000 Units total) by mouth every 7 (seven) days. For 8 weeks 04/17/15   Funches, Josalyn, MD  Current Outpatient Medications  Medication Sig Dispense Refill   diltiazem  2 % GEL Apply 1 application topically 2 (two) times daily. (Patient not taking: Reported on 05/06/2023) 30 g 1   DiltiaZEM  HCl POWD APPLY RECTALLY TWICE A DAY AS DIRECTED. (Patient not taking: Reported on 05/06/2023) 30 g 0   ferrous sulfate  325 (65 FE) MG tablet Take 1 tablet (325 mg total) by mouth 3 (three) times daily with meals. (Patient not taking: Reported on 05/06/2023) 90 tablet 3   hydrALAZINE (APRESOLINE) 10 MG tablet Take 10 mg by mouth 3 (three) times daily.      hydrocortisone -pramoxine (ANALPRAM -HC) 2.5-1 % rectal cream PLACE 1 APPLICATION RECTALLY 2 TIMES DAILY (Patient not taking: Reported on 05/06/2023) 60 g 0   ipratropium (ATROVENT ) 0.06 % nasal spray Place 2 sprays into both nostrils 4 (four) times daily. (Patient not taking: Reported on 05/06/2023) 15 mL 12   lansoprazole  (PREVACID ) 30 MG capsule TAKE 1 CAPSULE BY MOUTH 2 TIMES DAILY. 60 capsule 10   losartan  (COZAAR ) 25 MG tablet TAKE 1 TABLET BY MOUTH DAILY. 30 tablet 0   meloxicam (MOBIC) 15 MG tablet Take 15 mg by mouth daily.     oseltamivir  (TAMIFLU ) 75 MG capsule Take 1 capsule (75 mg total) by mouth every 12 (twelve) hours. (Patient not taking: Reported on 05/06/2023) 10 capsule 0   predniSONE  (DELTASONE ) 10 MG tablet Take 5 tab day 1, take 4 tab day 2, take 3 tab day 3, take 2 tab day 4, and take 1 tab day 5 15 tablet 0   ranitidine  (ZANTAC ) 150 MG tablet TAKE 1 TABLET BY MOUTH AT BEDTIME AS NEEDED FOR HEARTBURN. (Patient not taking: Reported on 05/06/2023) 30 tablet 9   Spacer/Aero-Holding Chambers (AEROCHAMBER MV) inhaler Use as instructed 1 each 0   Vitamin D , Ergocalciferol , (DRISDOL ) 50000 UNITS CAPS capsule Take 1 capsule (50,000 Units total) by mouth every 7 (seven) days. For 8 weeks 4 capsule 1   Current Facility-Administered Medications  Medication Dose Route Frequency Provider Last Rate Last Admin   0.9 %  sodium chloride  infusion  500 mL Intravenous Continuous Tibor Lemmons, Gordy HERO, MD        Allergies as of 05/27/2023 - Review Complete 05/25/2023  Allergen Reaction Noted   Fish allergy  05/06/2023   Shellfish-derived products  05/06/2023    Family History  Problem Relation Age of Onset   Colon cancer Father    Diabetes Father    Diabetes Paternal Uncle    Diabetes Paternal Grandmother    Stomach cancer Other    Kidney disease Neg Hx    Esophageal cancer Neg Hx    Gallbladder disease Neg Hx    Colon polyps Neg Hx    Rectal cancer Neg Hx     Social History    Socioeconomic History   Marital status: Single    Spouse name: Not on file   Number of children: 3   Years of education: Not on file   Highest education level: Not on file  Occupational History   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  Tobacco Use   Smoking status: Former    Current packs/day: 0.50    Average packs/day: 0.5 packs/day for 23.0 years (11.5 ttl pk-yrs)    Types: Cigarettes   Smokeless tobacco: Never  Substance and Sexual Activity   Alcohol use: No    Alcohol/week: 0.0 standard drinks of alcohol   Drug use: Yes    Comment: Occassional marijuana use   Sexual activity:  Not on file  Other Topics Concern   Not on file  Social History Narrative   Not on file   Social Drivers of Health   Financial Resource Strain: Not on file  Food Insecurity: Not on file  Transportation Needs: Not on file  Physical Activity: Not on file  Stress: Not on file  Social Connections: Not on file  Intimate Partner Violence: Not on file    Physical Exam: Vital signs in last 24 hours: @BP  137/84   Pulse 89   Temp 98.5 F (36.9 C)   Ht 5' 8 (1.727 m)   Wt 200 lb (90.7 kg)   SpO2 97%   BMI 30.41 kg/m  GEN: NAD EYE: Sclerae anicteric ENT: MMM CV: Non-tachycardic Pulm: CTA b/l GI: Soft, NT/ND NEURO:  Alert & Oriented x 3   Gordy Starch, MD [facility]  05/27/2023 3:04 PM

## 2023-05-27 NOTE — Progress Notes (Signed)
 Report given to PACU, vss

## 2023-05-27 NOTE — Progress Notes (Signed)
 Pt's states no medical or surgical changes since previsit or office visit.

## 2023-05-28 ENCOUNTER — Telehealth: Payer: Self-pay

## 2023-05-28 NOTE — Telephone Encounter (Signed)
  Follow up Call-     05/27/2023    2:58 PM  Call back number  Post procedure Call Back phone  # [phone]  Permission to leave phone message Yes     Patient questions:  Do you have a fever, pain , or abdominal swelling? No. Pain Score  0 *  Have you tolerated food without any problems? Yes.    Have you been able to return to your normal activities? Yes.    Do you have any questions about your discharge instructions: Diet   No. Medications  No. Follow up visit  No.  Do you have questions or concerns about your Care? No.  Actions: * If pain score is 4 or above: No action needed, pain <4.

## 2023-06-01 ENCOUNTER — Encounter: Payer: Self-pay | Admitting: Internal Medicine

## 2023-06-01 LAB — SURGICAL PATHOLOGY

## 2023-06-08 ENCOUNTER — Other Ambulatory Visit: Payer: Self-pay
